# Patient Record
Sex: Female | Born: 1975 | Race: White | Hispanic: No | Marital: Married | Smoking: Never smoker
Health system: Southern US, Community
[De-identification: ages and names within clinical notes are randomized; demographics above are authoritative.]

## PROBLEM LIST (undated history)

## (undated) DIAGNOSIS — F419 Anxiety disorder, unspecified: Secondary | ICD-10-CM

## (undated) DIAGNOSIS — M1990 Unspecified osteoarthritis, unspecified site: Secondary | ICD-10-CM

## (undated) DIAGNOSIS — I10 Essential (primary) hypertension: Secondary | ICD-10-CM

## (undated) DIAGNOSIS — G43909 Migraine, unspecified, not intractable, without status migrainosus: Secondary | ICD-10-CM

## (undated) HISTORY — DX: Anxiety disorder, unspecified: F41.9

## (undated) HISTORY — DX: Migraine, unspecified, not intractable, without status migrainosus: G43.909

## (undated) HISTORY — DX: Unspecified osteoarthritis, unspecified site: M19.90

## (undated) HISTORY — DX: Essential (primary) hypertension: I10

---

## 2018-07-20 ENCOUNTER — Encounter: Payer: Self-pay | Admitting: Family Medicine

## 2018-07-20 ENCOUNTER — Ambulatory Visit: Payer: BC Managed Care – PPO | Admitting: Family Medicine

## 2018-07-20 VITALS — BP 138/90 | HR 88 | Temp 98.2°F | Resp 12 | Ht 62.0 in | Wt 148.5 lb

## 2018-07-20 DIAGNOSIS — M159 Polyosteoarthritis, unspecified: Secondary | ICD-10-CM | POA: Insufficient documentation

## 2018-07-20 DIAGNOSIS — G43109 Migraine with aura, not intractable, without status migrainosus: Secondary | ICD-10-CM | POA: Insufficient documentation

## 2018-07-20 DIAGNOSIS — R030 Elevated blood-pressure reading, without diagnosis of hypertension: Secondary | ICD-10-CM

## 2018-07-20 DIAGNOSIS — F419 Anxiety disorder, unspecified: Secondary | ICD-10-CM

## 2018-07-20 DIAGNOSIS — Z13220 Encounter for screening for lipoid disorders: Secondary | ICD-10-CM | POA: Diagnosis not present

## 2018-07-20 LAB — BASIC METABOLIC PANEL
BUN: 9 mg/dL (ref 6–23)
CO2: 28 mEq/L (ref 19–32)
Calcium: 10 mg/dL (ref 8.4–10.5)
Chloride: 103 mEq/L (ref 96–112)
Creatinine, Ser: 0.62 mg/dL (ref 0.40–1.20)
GFR: 111.5 mL/min (ref >60.00)
Glucose, Bld: 96 mg/dL (ref 70–99)
Potassium: 4.8 mEq/L (ref 3.5–5.1)
Sodium: 140 mEq/L (ref 135–145)

## 2018-07-20 LAB — CBC
HCT: 43.2 % (ref 36.0–46.0)
Hemoglobin: 14.9 g/dL (ref 12.0–15.0)
MCHC: 34.6 g/dL (ref 30.0–36.0)
MCV: 85.3 fl (ref 78.0–100.0)
Platelets: 237 10*3/uL (ref 150.0–400.0)
RBC: 5.06 Mil/uL (ref 3.87–5.11)
RDW: 12.4 % (ref 11.5–15.5)
WBC: 7.6 10*3/uL (ref 4.0–10.5)

## 2018-07-20 LAB — LIPID PANEL
CHOLESTEROL: 180 mg/dL (ref 0–200)
HDL: 55.8 mg/dL (ref >39.00)
LDL Cholesterol: 104 mg/dL — ABNORMAL HIGH (ref 0–99)
NonHDL: 124.55
Total CHOL/HDL Ratio: 3
Triglycerides: 102 mg/dL (ref 0.0–149.0)
VLDL: 20.4 mg/dL (ref 0.0–40.0)

## 2018-07-20 LAB — TSH: TSH: 1.2 u[IU]/mL (ref 0.35–4.50)

## 2018-07-20 NOTE — Patient Instructions (Addendum)
A few things to remember from today's visit:   Elevated blood pressure reading - Plan: CBC, Basic metabolic panel, TSH  Generalized osteoarthritis of multiple sites  Screening for lipid disorders - Plan: Lipid panel  Continue monitoring blood pressure at home periodically. Low-salt diet and regular physical activity may help.  Osteoarthritis is a chronic condition and gets worse with age.  The following may help:  Over the counter topical medications: Icy Hot or Asper cream with Lidocaine. Tai Chi may help or any type of stretching, low impact exercise.  Avoid weight gain. Fish oil, over the counter Megared for example, 2 capsules daily. Turmeric with black pepper.     Please be sure medication list is accurate. If a new problem present, please set up appointment sooner than planned today.

## 2018-07-20 NOTE — Progress Notes (Signed)
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  Former PCP:N/A Last preventive routine visit: 04/12/2017, gynecologic preventive care. She is from [place] and taking test in order to be able to apply for residency. She moved from [place] in 03/2018.  Chronic medical problems:  Migraine headaches,improved after discontinuing OCPs about 3 months ago, still having frequent episodes but not as intense. Throbbing like pain, 7-8/10, it last most of the time < 24 hours but sometimes 48-72 bur mild.  She is on Relpax as needed. She is on Propranolol, which was initially prescribed for anxiety, she takes it prn. Dx with migraine 3-4 years ago. Right temporal with associated photophobia and nausea,no vomiting. Proceed sometime by visual aura.  She was in the ER in 2018 an reporting negative head CT.  Anxiety: Exacerbated by USMLA testing. She denies depressed mood or suicidal thoughts.  Concerns today: Elevated BP. No prior history of hypertension. She has been checking BP for the past couple months because episodes of face flash. Denies associated unusual headaches, visual changes, chest pain, dyspnea, palpitation, claudication, focal weakness, or edema.  BP 130-140's/90's. No Hx of HTN. She would like FLP done today.   She has not been exercising regularly and has not been consistent with a healthy diet,so she has gained some wt.  Joint pain, for years,exacerbated by repetitive movement when working up and prolonged walking (knees). IP joints,right hip,and knees.  No joint edema or erythema. She doe snot take OTC treatment.   Review of Systems  Constitutional: Negative for activity change, appetite change, fatigue, fever and unexpected weight change.  HENT: Negative for mouth sores, nosebleeds and trouble swallowing.   Eyes: Positive for photophobia. Negative for redness and visual disturbance.  Respiratory: Negative for  cough, shortness of breath and wheezing.   Cardiovascular: Negative for chest pain, palpitations and leg swelling.  Gastrointestinal: Positive for nausea. Negative for abdominal pain and vomiting.       Negative for changes in bowel habits.  Genitourinary: Negative for decreased urine volume, dysuria and hematuria.  Musculoskeletal: Positive for arthralgias. Negative for gait problem and joint swelling.  Neurological: Positive for headaches. Negative for syncope, weakness and numbness.  Psychiatric/Behavioral: Negative for confusion. The patient is nervous/anxious.       Current Outpatient Medications on File Prior to Visit  Medication Sig Dispense Refill  . amitriptyline (ELAVIL) 25 MG tablet Take 25 mg by mouth at bedtime. 1/2 tablet daily    . Cholecalciferol (VITAMIN D3) 50 MCG (2000 UT) TABS Take by mouth. 1 tablet twice a week    . eletriptan (RELPAX) 20 MG tablet Take 20 mg by mouth as needed for migraine or headache. May repeat in 2 hours if headache persists or recurs.    . Multiple Vitamin (MULTIVITAMIN) tablet Take 1 tablet by mouth. 1 tablet twice a week    . Omega-3 Fatty Acids (OMEGA 3 PO) Take 100 mg by mouth daily.    . propranolol (INDERAL) 10 MG tablet Take 10 mg by mouth as needed.    . sertraline (ZOLOFT) 25 MG tablet Take 25 mg by mouth daily. 1/2 tablet daily     No current facility-administered medications on file prior to visit.      Past Medical History:  Diagnosis Date  . Arthritis   . Hypertension   . Migraines    No Known Allergies  Family History  Problem Relation Age of  Onset  . Arthritis Mother   . Hypertension Mother     Social History   Socioeconomic History  . Marital status: Married    Spouse name: Not on file  . Number of children: Not on file  . Years of education: Not on file  . Highest education level: Not on file  Occupational History  . Not on file  Social Needs  . Financial resource strain: Not on file  . Food insecurity:      Worry: Not on file    Inability: Not on file  . Transportation needs:    Medical: Not on file    Non-medical: Not on file  Tobacco Use  . Smoking status: Never Smoker  . Smokeless tobacco: Never Used  Substance and Sexual Activity  . Alcohol use: Not Currently  . Drug use: Never  . Sexual activity: Yes    Partners: Male  Lifestyle  . Physical activity:    Days per week: Not on file    Minutes per session: Not on file  . Stress: Not on file  Relationships  . Social connections:    Talks on phone: Not on file    Gets together: Not on file    Attends religious service: Not on file    Active member of club or organization: Not on file    Attends meetings of clubs or organizations: Not on file    Relationship status: Not on file  Other Topics Concern  . Not on file  Social History Narrative  . Not on file    Vitals:   07/20/18 1145 07/20/18 1233  BP: 124/83 138/90  Pulse: 88   Resp: 12   Temp: 98.2 F (36.8 C)   SpO2: 99%     Body mass index is 27.16 kg/m.  Physical Exam  Nursing note and vitals reviewed. Constitutional: She is oriented to person, place, and time. She appears well-developed and well-nourished. No distress.  HENT:  Head: Normocephalic and atraumatic.  Mouth/Throat: Oropharynx is clear and moist and mucous membranes are normal.  Eyes: Pupils are equal, round, and reactive to light. Conjunctivae are normal.  Cardiovascular: Normal rate and regular rhythm.  No murmur heard. Pulses:      Dorsalis pedis pulses are 2+ on the right side and 2+ on the left side.  Respiratory: Effort normal and breath sounds normal. No respiratory distress.  GI: Soft. She exhibits no mass. There is no hepatomegaly. There is no abdominal tenderness.  Musculoskeletal:        General: No edema.     Comments: No signs of synovitis or deformities.  Lymphadenopathy:    She has no cervical adenopathy.  Neurological: She is alert and oriented to person, place, and time.  She has normal strength. No cranial nerve deficit. Gait normal.  Skin: Skin is warm. No rash noted. No erythema.  Psychiatric: She has a normal mood and affect.  Well groomed, good eye contact.      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  Diagnoses and all orders for this visit:  Elevated blood pressure reading BP rechecked 138/90. We reviewed HTN criteria guidelines. She is not interested in starting antihypertensive med. She would like to monitor BP at home and start exercising regularly. Low salt diet recommended. F/U in 4-6 weeks.  -     CBC -     Basic metabolic panel -     TSH  Generalized osteoarthritis of multiple sites We  discussed treatment options and prognosis. Tylenol 500 mg tid,Tumeric with black pepper,and Fish oil may help.  Migraine with aura and without status migrainosus, not intractable Improved with stopping OCP's. She is not interested in Topamax because trying to get pregnant. Instructed about warning signs.  Screening for lipid disorders -     Lipid panel  Anxiety disorder, unspecified type No changes in Zoloft 25 mg daily. Continue Propranolol 10 mg bid as needed. Instructed about warning signs.   Recommend establishing with gyn/ob to discuss her meds and plan accordingly. Folic acid recommended.      Deontray Hunnicutt G. [place]Jordan, MD  [facility]. [place] office.

## 2018-07-23 ENCOUNTER — Telehealth: Payer: Self-pay | Admitting: Family Medicine

## 2018-07-23 ENCOUNTER — Encounter: Payer: Self-pay | Admitting: Family Medicine

## 2018-07-23 ENCOUNTER — Ambulatory Visit: Payer: Self-pay | Admitting: *Deleted

## 2018-07-23 NOTE — Telephone Encounter (Signed)
Copied from CRM [phone]. Topic: Quick Communication - Lab Results (Clinic Use ONLY) >> Jul 23, 2018 12:51 PM Jobe GibbonJones, Quaneisha S, CMA wrote: Called patient to inform them of their lab results. When patient returns call, triage nurse may disclose results.

## 2018-07-23 NOTE — Telephone Encounter (Signed)
Pt calling for lab results. See result note.   Reason for Disposition . Caller requesting lab results  Answer Assessment - Initial Assessment Questions 1. REASON FOR CALL or QUESTION: "What is your reason for calling today?" or "How can I best help you?" or "What question do you have that I can help answer?"    Call to receive lab results 2. CALLER: Document the source of call. (e.g., laboratory, patient).     patient  Protocols used: PCP CALL - NO TRIAGE-A-AH

## 2018-07-23 NOTE — Telephone Encounter (Signed)
See result note.  

## 2018-08-27 ENCOUNTER — Encounter: Payer: Self-pay | Admitting: Family Medicine

## 2018-08-27 ENCOUNTER — Ambulatory Visit: Payer: BC Managed Care – PPO | Admitting: Family Medicine

## 2018-08-27 VITALS — BP 126/84 | HR 100 | Temp 98.4°F | Resp 12 | Ht 62.0 in | Wt 142.5 lb

## 2018-08-27 DIAGNOSIS — F411 Generalized anxiety disorder: Secondary | ICD-10-CM | POA: Diagnosis not present

## 2018-08-27 DIAGNOSIS — G43109 Migraine with aura, not intractable, without status migrainosus: Secondary | ICD-10-CM

## 2018-08-27 DIAGNOSIS — Z30011 Encounter for initial prescription of contraceptive pills: Secondary | ICD-10-CM | POA: Diagnosis not present

## 2018-08-27 DIAGNOSIS — I10 Essential (primary) hypertension: Secondary | ICD-10-CM | POA: Diagnosis not present

## 2018-08-27 MED ORDER — AMITRIPTYLINE HCL 25 MG PO TABS: 25.0000 mg | ORAL_TABLET | Freq: Every day | ORAL | 1 refills | Status: DC

## 2018-08-27 MED ORDER — SERTRALINE HCL 25 MG PO TABS: 25.0000 mg | ORAL_TABLET | Freq: Every day | ORAL | 2 refills | Status: DC

## 2018-08-27 MED ORDER — NORGESTIMATE-ETH ESTRADIOL 0.25-35 MG-MCG PO TABS: 1.0000 | ORAL_TABLET | Freq: Every day | ORAL | 1 refills | Status: DC

## 2018-08-27 MED ORDER — ELETRIPTAN HYDROBROMIDE 20 MG PO TABS: 20.0000 mg | ORAL_TABLET | Freq: Every day | ORAL | 3 refills | Status: DC | PRN

## 2018-08-27 MED ORDER — LABETALOL HCL 100 MG PO TABS: 100.0000 mg | ORAL_TABLET | Freq: Two times a day (BID) | ORAL | 2 refills | Status: DC

## 2018-08-27 NOTE — Patient Instructions (Addendum)
A few things to remember from today's visit:   Encounter for initial prescription of contraceptive pills - Plan: norgestimate-ethinyl estradiol (ORTHO-CYCLEN, 28,) 0.25-35 MG-MCG tablet  Benign essential hypertension - Plan: labetalol (NORMODYNE) 100 MG tablet   Labetalol started today. Please be sure medication list is accurate. If a new problem present, please set up appointment sooner than planned today.

## 2018-08-27 NOTE — Progress Notes (Signed)
[name] is a 43 y.o.female, who is here today with her husband to follow on elevated BP. She was seen on 07/20/2018, at that time pharmacologic treatment for hypertension was recommended but she preferred to try nonpharmacologic treatment for now.  She takes propranolol 10 mg twice daily as needed for headache and anxiety.   She has not noted unusual headache, visual changes, exertional chest pain, dyspnea,  focal weakness, or edema. Home BP readings: Most 130's-140's/90's. A few SBP's 150's.   Lab Results  Component Value Date   CREATININE 0.62 07/20/2018   BUN 9 07/20/2018   NA 140 07/20/2018   K 4.8 07/20/2018   CL 103 07/20/2018   CO2 28 07/20/2018   + Stress.  She is not now sure if she wants to get pregnant. She would like to wait until her BP is better controlled. She would like to start OCP's low dose.  LMP 08/11/18  Migraine and insomnia,she is taking Amitriptyline 25 mg daily. It helps with sleep and helping with headache frequency. Throbbing right temporal headaches 2 times per month. Associated photophobia and nausea.  It has been better after stopping OCP's 4 months ago.  Anxiety, she is on Zoloft 25 mg daily. She denies depressed mood or suicidal thoughts. Exacerbated by stress, studying for her USMLA's, which she postponed again, plannin gin taking test 10/2018.    Review of Systems  Constitutional: Negative for activity change, appetite change, fatigue and fever.  HENT: Negative for mouth sores, nosebleeds and trouble swallowing.   Eyes: Negative for redness and visual disturbance.  Respiratory: Negative for cough, shortness of breath and wheezing.   Cardiovascular: Negative for chest pain, palpitations and leg swelling.  Gastrointestinal: Negative for abdominal pain and vomiting.       Negative for changes in bowel habits.  Genitourinary: Negative for decreased urine volume, hematuria and menstrual problem.  Neurological: Positive for  headaches. Negative for syncope and weakness.  Psychiatric/Behavioral: Positive for sleep disturbance. Negative for confusion. The patient is nervous/anxious.      Current Outpatient Medications on File Prior to Visit  Medication Sig Dispense Refill  . Cholecalciferol (VITAMIN D3) 50 MCG (2000 UT) TABS Take by mouth. 1 tablet twice a week    . Multiple Vitamin (MULTIVITAMIN) tablet Take 1 tablet by mouth. 1 tablet twice a week    . Omega-3 Fatty Acids (OMEGA 3 PO) Take 100 mg by mouth daily.     No current facility-administered medications on file prior to visit.      Past Medical History:  Diagnosis Date  . Anxiety   . Arthritis   . Hypertension   . Migraines     No Known Allergies  Social History   Socioeconomic History  . Marital status: Married    Spouse name: Not on file  . Number of children: Not on file  . Years of education: Not on file  . Highest education level: Not on file  Occupational History  . Not on file  Social Needs  . Financial resource strain: Not on file  . Food insecurity:    Worry: Not on file    Inability: Not on file  . Transportation needs:    Medical: Not on file    Non-medical: Not on file  Tobacco Use  . Smoking status: Never Smoker  . Smokeless tobacco: Never Used  Substance and Sexual Activity  . Alcohol use: Not Currently  . Drug use: Never  . Sexual activity: Yes  Partners: Male  Lifestyle  . Physical activity:    Days per week: Not on file    Minutes per session: Not on file  . Stress: Not on file  Relationships  . Social connections:    Talks on phone: Not on file    Gets together: Not on file    Attends religious service: Not on file    Active member of club or organization: Not on file    Attends meetings of clubs or organizations: Not on file    Relationship status: Not on file  Other Topics Concern  . Not on file  Social History Narrative  . Not on file    Vitals:   08/27/18 1416  BP: 126/84  Pulse: 100    Resp: 12  Temp: 98.4 F (36.9 C)  SpO2: 99%   Body mass index is 26.06 kg/m.   Physical Exam  Nursing note and vitals reviewed. Constitutional: She is oriented to person, place, and time. She appears well-developed and well-nourished. No distress.  HENT:  Head: Normocephalic and atraumatic.  Mouth/Throat: Oropharynx is clear and moist and mucous membranes are normal.  Eyes: Pupils are equal, round, and reactive to light. Conjunctivae are normal.  Cardiovascular: Normal rate and regular rhythm.  No murmur heard. Pulses:      Dorsalis pedis pulses are 2+ on the right side and 2+ on the left side.  Respiratory: Effort normal and breath sounds normal. No respiratory distress.  GI: Soft. She exhibits no mass. There is no hepatomegaly. There is no abdominal tenderness.  Musculoskeletal:        General: No edema.  Lymphadenopathy:    She has no cervical adenopathy.  Neurological: She is alert and oriented to person, place, and time. She has normal strength. No cranial nerve deficit. Gait normal.  Skin: Skin is warm. No rash noted. No erythema.  Psychiatric: She has a normal mood and affect.  Well groomed, good eye contact.    ASSESSMENT AND PLAN:   [name] was seen today for follow-up.  Diagnoses and all orders for this visit:  Benign essential hypertension Because she may be planning on getting pregnant within a year or so,Labetalol recommended. Labetalol side effects discussed. Continue monitoring BP. Low salt diet. F/U in 3 months.  -     labetalol (NORMODYNE) 100 MG tablet; Take 1 tablet (100 mg total) by mouth 2 (two) times daily.  Encounter for initial prescription of contraceptive pills A few months ago she felt like migraine improved after discontinuing OCP's. She would like to try again for at least 6 months. We discussed side effects,including thrombotic events,mood changes,anf ehadace -     norgestimate-ethinyl estradiol (ORTHO-CYCLEN, 28,) 0.25-35 MG-MCG  tablet; Take 1 tablet by mouth daily.   Migraine with aura and without status migrainosus, not intractable Improved. No changes in current management.  -     eletriptan (RELPAX) 20 MG tablet; Take 1 tablet (20 mg total) by mouth daily as needed for migraine or headache. May repeat in 2 hours if headache persists or recurs. -     amitriptyline (ELAVIL) 25 MG tablet; Take 1 tablet (25 mg total) by mouth at bedtime.  GAD (generalized anxiety disorder) Stable. No changes in Zoloft dose. Continue Propranolol bid prn.  -     sertraline (ZOLOFT) 25 MG tablet; Take 1 tablet (25 mg total) by mouth daily. 1/2 tablet daily     Return in about 3 months (around 11/26/2018) for HTN.    Kathie RhodesBetty  G. [place], MD  [facility]. [place] office.

## 2018-08-30 ENCOUNTER — Encounter: Payer: Self-pay | Admitting: Family Medicine

## 2018-10-10 ENCOUNTER — Ambulatory Visit: Payer: BC Managed Care – PPO | Admitting: Family Medicine

## 2018-11-19 ENCOUNTER — Other Ambulatory Visit: Payer: Self-pay | Admitting: Family Medicine

## 2018-11-19 DIAGNOSIS — I10 Essential (primary) hypertension: Secondary | ICD-10-CM

## 2018-11-26 ENCOUNTER — Other Ambulatory Visit: Payer: Self-pay

## 2018-11-26 ENCOUNTER — Encounter: Payer: BC Managed Care – PPO | Admitting: Family Medicine

## 2018-11-26 NOTE — Progress Notes (Signed)
Appt was cancelled.  

## 2019-01-28 ENCOUNTER — Other Ambulatory Visit: Payer: Self-pay

## 2019-01-28 ENCOUNTER — Ambulatory Visit (INDEPENDENT_AMBULATORY_CARE_PROVIDER_SITE_OTHER): Payer: BC Managed Care – PPO | Admitting: Family Medicine

## 2019-01-28 ENCOUNTER — Encounter: Payer: Self-pay | Admitting: Family Medicine

## 2019-01-28 VITALS — BP 122/74 | HR 91 | Temp 98.2°F | Resp 12 | Ht 62.0 in | Wt 143.2 lb

## 2019-01-28 DIAGNOSIS — Z3041 Encounter for surveillance of contraceptive pills: Secondary | ICD-10-CM | POA: Diagnosis not present

## 2019-01-28 DIAGNOSIS — L304 Erythema intertrigo: Secondary | ICD-10-CM

## 2019-01-28 DIAGNOSIS — I10 Essential (primary) hypertension: Secondary | ICD-10-CM | POA: Insufficient documentation

## 2019-01-28 DIAGNOSIS — H9313 Tinnitus, bilateral: Secondary | ICD-10-CM

## 2019-01-28 DIAGNOSIS — F411 Generalized anxiety disorder: Secondary | ICD-10-CM

## 2019-01-28 DIAGNOSIS — Z30011 Encounter for initial prescription of contraceptive pills: Secondary | ICD-10-CM | POA: Insufficient documentation

## 2019-01-28 MED ORDER — LABETALOL HCL 100 MG PO TABS: 100.0000 mg | ORAL_TABLET | Freq: Two times a day (BID) | ORAL | 1 refills | Status: DC

## 2019-01-28 MED ORDER — NORGESTIMATE-ETH ESTRADIOL 0.25-35 MG-MCG PO TABS: 1.0000 | ORAL_TABLET | Freq: Every day | ORAL | 3 refills | Status: DC

## 2019-01-28 MED ORDER — NYSTATIN-TRIAMCINOLONE 100000-0.1 UNIT/GM-% EX CREA: 1.0000 "application " | TOPICAL_CREAM | Freq: Two times a day (BID) | CUTANEOUS | 1 refills | Status: DC | PRN

## 2019-01-28 NOTE — Assessment & Plan Note (Signed)
We discussed possible etiologies, most likely benign and related to hearing loss. She would like to see ENT. Instructed about warning signs.

## 2019-01-28 NOTE — Assessment & Plan Note (Signed)
BP adequately controlled. BP goal <= 130/80. No changes to labetalol 100 mg twice daily. Continue monitoring BP at home. Low-salt diet recommended. She would like to follow annually, before if needed.

## 2019-01-28 NOTE — Patient Instructions (Signed)
A few things to remember from today's visit:   Tinnitus of both ears - Plan: Ambulatory referral to ENT  Encounter for initial prescription of contraceptive pills - Plan: norgestimate-ethinyl estradiol (ORTHO-CYCLEN, 28,) 0.25-35 MG-MCG tablet  Intertrigo - Plan: nystatin-triamcinolone (MYCOLOG II) cream  Benign essential hypertension - Plan: labetalol (NORMODYNE) 100 MG tablet   Please be sure medication list is accurate. If a new problem present, please set up appointment sooner than planned today.

## 2019-01-28 NOTE — Assessment & Plan Note (Signed)
Otherwise stable. No changes in sertraline 25 mg daily. Instructed about warning signs. We will continue following annually, before if needed.

## 2019-01-28 NOTE — Progress Notes (Signed)
HPI:   [name] is a 43 y.o. female, who is here today for chronic disease management.  She was last seen on 11/26/2018. Today she is complaining about 3 years history of tinnitus, it seems to be getting worse in dry ear. She has seen ENT in the past and was Dx with mild hearing loss, she has not noted any change. Negative for earache and ear drainage. States that she cannot pop her right ear when she does auto inflation maneuvers.  Also having intermittent nasal congestion, she thinks she may have nasal polyps. Nosebleed intermittently for 3 days. She has not identified exacerbating or alleviating factors. She is not using OTC medications.   HTN Currently she is on labetalol 100 mg twice daily.  She is checking BPs regularly, usually 120s/80s, occasionally 140s/90. She is tolerating medication well. She denies unusual headache, visual changes, chest pain, dyspnea, palpitation, focal weakness, or edema.  Lab Results  Component Value Date   CREATININE 0.62 07/20/2018   BUN 9 07/20/2018   NA 140 07/20/2018   K 4.8 07/20/2018   CL 103 07/20/2018   CO2 28 07/20/2018   Anxiety, currently she is on sertraline 25 mg daily. She denies depressed mood. Her USMLA testing was postponed for about 18 months, this has exacerbated anxiety.  She also takes amitriptyline 25 mg at night to treat migraine headaches.  Requesting refill on OCPs, which she has tolerated well. She is not sure about trying to get pregnant given her age,also trying to take all exams needed to apply for residency.  She is also complaining about a couple months of erythematous and very pruritic rash on her breast, bilateral. He seems to be exacerbated by health weather and sweating. She has not tried OTC medications.  Review of Systems  Constitutional: Negative for activity change, appetite change, fatigue and fever.  HENT: Negative for mouth sores, sinus pressure, sore throat and trouble  swallowing.   Eyes: Negative for redness and itching.  Respiratory: Negative for cough, shortness of breath and wheezing.   Gastrointestinal: Negative for abdominal pain, nausea and vomiting.       Negative for changes in bowel habits.  Genitourinary: Negative for decreased urine volume, dysuria and hematuria.  Musculoskeletal: Negative for gait problem and myalgias.  Skin: Negative for rash.  Allergic/Immunologic: Positive for environmental allergies.  Neurological: Negative for syncope, facial asymmetry and speech difficulty.  Hematological: Negative for adenopathy. Does not bruise/bleed easily.  Psychiatric/Behavioral: Negative for sleep disturbance and suicidal ideas.  Rest see pertinent positives and negatives per HPI.   Current Outpatient Medications on File Prior to Visit  Medication Sig Dispense Refill  . amitriptyline (ELAVIL) 25 MG tablet Take 1 tablet (25 mg total) by mouth at bedtime. 90 tablet 1  . Cholecalciferol (VITAMIN D3) 50 MCG (2000 UT) TABS Take by mouth. 1 tablet twice a week    . eletriptan (RELPAX) 20 MG tablet Take 1 tablet (20 mg total) by mouth daily as needed for migraine or headache. May repeat in 2 hours if headache persists or recurs. 10 tablet 3  . Multiple Vitamin (MULTIVITAMIN) tablet Take 1 tablet by mouth. 1 tablet twice a week    . Omega-3 Fatty Acids (OMEGA 3 PO) Take 100 mg by mouth daily.    . sertraline (ZOLOFT) 25 MG tablet Take 1 tablet (25 mg total) by mouth daily. 1/2 tablet daily 90 tablet 2   No current facility-administered medications on file prior to visit.  Past Medical History:  Diagnosis Date  . Anxiety   . Arthritis   . Hypertension   . Migraines    No Known Allergies  Social History   Socioeconomic History  . Marital status: Married    Spouse name: Not on file  . Number of children: Not on file  . Years of education: Not on file  . Highest education level: Not on file  Occupational History  . Not on file  Social  Needs  . Financial resource strain: Not on file  . Food insecurity    Worry: Not on file    Inability: Not on file  . Transportation needs    Medical: Not on file    Non-medical: Not on file  Tobacco Use  . Smoking status: Never Smoker  . Smokeless tobacco: Never Used  Substance and Sexual Activity  . Alcohol use: Not Currently  . Drug use: Never  . Sexual activity: Yes    Partners: Male  Lifestyle  . Physical activity    Days per week: Not on file    Minutes per session: Not on file  . Stress: Not on file  Relationships  . Social Musicianconnections    Talks on phone: Not on file    Gets together: Not on file    Attends religious service: Not on file    Active member of club or organization: Not on file    Attends meetings of clubs or organizations: Not on file    Relationship status: Not on file  Other Topics Concern  . Not on file  Social History Narrative  . Not on file    Vitals:   01/28/19 1635  BP: 122/74  Pulse: 91  Resp: 12  Temp: 98.2 F (36.8 C)  SpO2: 98%   Body mass index is 26.2 kg/m.  Physical Exam  Nursing note and vitals reviewed. Constitutional: She is oriented to person, place, and time. She appears well-developed and well-nourished. No distress.  HENT:  Head: Normocephalic and atraumatic.  Right Ear: External ear and ear canal normal. A middle ear effusion is present.  Left Ear: Tympanic membrane, external ear and ear canal normal.  Nose: Septal deviation present. No mucosal edema, rhinorrhea or nasal septal hematoma.  Mouth/Throat: Oropharynx is clear and moist and mucous membranes are normal.  Septum mucosa with small bloody area, 1 x each nostril, where bleeding occurred. No active bleeding.  Eyes: Pupils are equal, round, and reactive to light. Conjunctivae are normal.  Cardiovascular: Normal rate and regular rhythm.  No murmur heard. Pulses:      Dorsalis pedis pulses are 2+ on the right side and 2+ on the left side.  Respiratory: Effort  normal and breath sounds normal. No respiratory distress.  GI: Soft. She exhibits no mass. There is no hepatomegaly. There is no abdominal tenderness.  Musculoskeletal:        General: No edema.  Lymphadenopathy:    She has no cervical adenopathy.  Neurological: She is alert and oriented to person, place, and time. She has normal strength. No cranial nerve deficit. Gait normal.  Skin: Skin is warm. Rash noted. Rash is macular. There is erythema.  Erythematous area under left breast.  Psychiatric: Her mood appears anxious.  Well groomed, good eye contact.    ASSESSMENT AND PLAN:  Ms. [name] was seen today for tinnitus and feels like polyp in nose.  Diagnoses and all orders for this visit:  Intertrigo Topical treatment recommended. Recommend trying to keep area  dry. Follow-up as needed.  -     nystatin-triamcinolone (MYCOLOG II) cream; Apply 1 application topically 2 (two) times daily as needed.  GAD (generalized anxiety disorder) Otherwise stable. No changes in sertraline 25 mg daily. Instructed about warning signs. We will continue following annually, before if needed.  Tinnitus of both ears We discussed possible etiologies, most likely benign and related to hearing loss. She would like to see ENT. Instructed about warning signs.  Encounter for initial prescription of contraceptive pills Tolerating OCP as well. No changes in current management. We discussed some side effects. She can continue following annually.  Benign essential hypertension BP adequately controlled. BP goal <= 130/80. No changes to labetalol 100 mg twice daily. Continue monitoring BP at home. Low-salt diet recommended. She would like to follow annually, before if needed.   Return in about 1 year (around 01/28/2020) for cpe.   -[name] was advised to return sooner than planned today if new concerns arise.    Veronica Fisher G. [place]az[place], MD  [facility]. [place] office.

## 2019-01-28 NOTE — Assessment & Plan Note (Signed)
Tolerating OCP as well. No changes in current management. We discussed some side effects. She can continue following annually.

## 2019-02-04 ENCOUNTER — Other Ambulatory Visit: Payer: Self-pay | Admitting: Family Medicine

## 2019-02-04 DIAGNOSIS — Z3041 Encounter for surveillance of contraceptive pills: Secondary | ICD-10-CM

## 2019-02-10 ENCOUNTER — Other Ambulatory Visit: Payer: Self-pay | Admitting: Family Medicine

## 2019-02-10 DIAGNOSIS — I10 Essential (primary) hypertension: Secondary | ICD-10-CM

## 2019-02-25 ENCOUNTER — Other Ambulatory Visit: Payer: Self-pay | Admitting: Family Medicine

## 2019-02-25 DIAGNOSIS — G43109 Migraine with aura, not intractable, without status migrainosus: Secondary | ICD-10-CM

## 2019-03-01 ENCOUNTER — Other Ambulatory Visit: Payer: Self-pay | Admitting: Family Medicine

## 2019-03-01 DIAGNOSIS — G43109 Migraine with aura, not intractable, without status migrainosus: Secondary | ICD-10-CM

## 2019-03-05 NOTE — Telephone Encounter (Signed)
Patient need to schedule an ov for more refills. 

## 2019-03-19 ENCOUNTER — Telehealth: Payer: Self-pay | Admitting: Family Medicine

## 2019-03-19 NOTE — Telephone Encounter (Signed)
eletriptan (RELPAX) 20 MG tablet   Send to [facility]

## 2019-03-19 NOTE — Telephone Encounter (Signed)
Message sent to Dr. Jordan for review and approval. 

## 2019-03-26 ENCOUNTER — Other Ambulatory Visit: Payer: Self-pay | Admitting: Family Medicine

## 2019-03-26 DIAGNOSIS — G43109 Migraine with aura, not intractable, without status migrainosus: Secondary | ICD-10-CM

## 2019-03-26 MED ORDER — ELETRIPTAN HYDROBROMIDE 20 MG PO TABS: 20.0000 mg | ORAL_TABLET | Freq: Every day | ORAL | 3 refills | Status: DC | PRN

## 2019-03-26 NOTE — Telephone Encounter (Signed)
Patient informed and verbalized understanding

## 2019-03-26 NOTE — Telephone Encounter (Signed)
Rx sent. Please advise that if she needs to take med more frequent,we will need follow up visit. Thanks, BJ

## 2019-04-10 ENCOUNTER — Encounter: Payer: Self-pay | Admitting: Family Medicine

## 2019-04-10 ENCOUNTER — Ambulatory Visit: Payer: BC Managed Care – PPO | Admitting: Family Medicine

## 2019-04-10 ENCOUNTER — Other Ambulatory Visit: Payer: Self-pay

## 2019-04-10 VITALS — BP 90/70 | HR 82 | Temp 98.4°F | Resp 12 | Ht 62.0 in | Wt 142.4 lb

## 2019-04-10 DIAGNOSIS — L811 Chloasma: Secondary | ICD-10-CM | POA: Diagnosis not present

## 2019-04-10 MED ORDER — HYDROQUINONE 4 % EX CREA: TOPICAL_CREAM | Freq: Two times a day (BID) | CUTANEOUS | 6 refills | Status: AC

## 2019-04-10 MED ORDER — TRETINOIN 0.1 % EX CREA: TOPICAL_CREAM | Freq: Every day | CUTANEOUS | 6 refills | Status: DC

## 2019-04-10 NOTE — Progress Notes (Signed)
ACUTE VISIT   HPI:  Chief Complaint  Patient presents with  . Skin Discoloration    Pt present for skin discoloration around hair line to cheek bone. Pt stated that it started off as a spot on her left side of face a year ago. Now pt stated that the spot has spreaded to other side of face and hairline. Pt has been taking retinol with mild relief.     Veronica Fisher is a 43 y.o. female, who is here today complaining of hyperpigmented areas on face as described above. She is on OCP's and seems to be getting darker for the past few months. Problem is constant.  No pain,erythema,or pruritus. She is applying retinol 0.05% and would like a higher %.  She has not noted pigmentation changes in other areas.  Negative for fever,chills,unusual fatigue,or arthralgias.  Review of Systems  Constitutional: Negative for activity change, appetite change and unexpected weight change.  HENT: Negative for mouth sores and sore throat.   Eyes: Negative for discharge and redness.  Respiratory: Negative for cough, chest tightness, shortness of breath and wheezing.   Gastrointestinal: Negative for abdominal pain, nausea and vomiting.  Endocrine: Negative for cold intolerance and heat intolerance.  Musculoskeletal: Negative for gait problem and myalgias.  Skin: Negative for rash.  Neurological: Negative for weakness, numbness and headaches.  Rest see pertinent positives and negatives per HPI.   Current Outpatient Medications on File Prior to Visit  Medication Sig Dispense Refill  . amitriptyline (ELAVIL) 25 MG tablet TAKE 1 TABLET BY MOUTH EVERYDAY AT BEDTIME 90 tablet 1  . Cholecalciferol (VITAMIN D3) 50 MCG (2000 UT) TABS Take by mouth. 1 tablet twice a week    . eletriptan (RELPAX) 20 MG tablet Take 1 tablet (20 mg total) by mouth daily as needed for migraine or headache. May repeat in 2 hours if headache persists or recurs. 10 tablet 3  . labetalol (NORMODYNE) 100 MG tablet TAKE 1  TABLET BY MOUTH TWICE A DAY 180 tablet 0  . Multiple Vitamin (MULTIVITAMIN) tablet Take 1 tablet by mouth. 1 tablet twice a week    . nystatin-triamcinolone (MYCOLOG II) cream Apply 1 application topically 2 (two) times daily as needed. 45 g 1  . Omega-3 Fatty Acids (OMEGA 3 PO) Take 100 mg by mouth daily.    . sertraline (ZOLOFT) 25 MG tablet Take 1 tablet (25 mg total) by mouth daily. 1/2 tablet daily 90 tablet 2  . SPRINTEC 28 0.25-35 MG-MCG tablet TAKE 1 TABLET BY MOUTH EVERY DAY 84 tablet 1   No current facility-administered medications on file prior to visit.      Past Medical History:  Diagnosis Date  . Anxiety   . Arthritis   . Hypertension   . Migraines    No Known Allergies  Social History   Socioeconomic History  . Marital status: Married    Spouse name: Not on file  . Number of children: Not on file  . Years of education: Not on file  . Highest education level: Not on file  Occupational History  . Not on file  Social Needs  . Financial resource strain: Not on file  . Food insecurity    Worry: Not on file    Inability: Not on file  . Transportation needs    Medical: Not on file    Non-medical: Not on file  Tobacco Use  . Smoking status: Never Smoker  . Smokeless tobacco: Never Used  Substance  and Sexual Activity  . Alcohol use: Not Currently  . Drug use: Never  . Sexual activity: Yes    Partners: Male  Lifestyle  . Physical activity    Days per week: Not on file    Minutes per session: Not on file  . Stress: Not on file  Relationships  . Social Musicianconnections    Talks on phone: Not on file    Gets together: Not on file    Attends religious service: Not on file    Active member of club or organization: Not on file    Attends meetings of clubs or organizations: Not on file    Relationship status: Not on file  Other Topics Concern  . Not on file  Social History Narrative  . Not on file    Vitals:   04/10/19 1131  BP: 90/70  Pulse: 82  Resp: 12   Temp: 98.4 F (36.9 C)  SpO2: 99%   Body mass index is 26.05 kg/m.  Physical Exam  Nursing note and vitals reviewed. Constitutional: She is oriented to person, place, and time. She appears well-developed. No distress.  HENT:  Head: Normocephalic and atraumatic.    Mouth/Throat: Oropharynx is clear and moist and mucous membranes are normal.  Eyes: Conjunctivae are normal.  Cardiovascular: Normal rate and regular rhythm.  No murmur heard. Respiratory: Effort normal and breath sounds normal. No respiratory distress.  Musculoskeletal:        General: No edema.  Lymphadenopathy:    She has no cervical adenopathy.  Neurological: She is alert and oriented to person, place, and time. She has normal strength. Gait normal.  Skin: Skin is warm. No rash noted. No erythema.  Slight hyperpigmented macular lesions on forehead and left cheek. No tender. See graphic HENT.  Psychiatric: Her mood appears anxious.  Well groomed, good eye contact.   ASSESSMENT AND PLAN:  Ms. [name] was seen today for skin discoloration.  Diagnoses and all orders for this visit:  Melasma -     tretinoin (RETIN-A) 0.1 % cream; Apply topically at bedtime. -     hydroquinone 4 % cream; Apply topically 2 (two) times daily.  Other possible etiologies discussed. Examination and Hx do not suggest a serious process. Treatment options discussed. Tretinoin 0.1% and Hydroquinone recommended.  OCP's could be aggravating problem. Sun screening. F/U as needed.  Return if symptoms worsen or fail to improve.    Zahirah Cheslock G. [place], MD  [facility]. [place] office.

## 2019-04-10 NOTE — Patient Instructions (Signed)
A few things to remember from today's visit:   Melasma - Plan: tretinoin (RETIN-A) 0.1 % cream, hydroquinone 4 % cream   Melasma Melasma is a skin condition that causes areas of darker coloring. It usually appears in patches on the cheeks, forehead, upper lip, and neck. These patches can look like a mask. The discolored areas do not itch and are not red or swollen. Melasma is not contagious. This means that it does not spread from person to person. What are the causes? The cause of this condition is not known. However, it can be started by certain triggers, such as:  Being out in the sun.  Allergies to medicines or cosmetics.  Changes in your hormones, such as: ? Taking birth control medicines. ? Taking hormone replacement therapy. ? Being pregnant. What increases the risk? The following factors may make you more likely to develop this condition:  Being a woman. Melasma is less common in men.  Having a family history of melasma.  Having darker skin.  Living in a tropical climate. What are the signs or symptoms? The only symptom of this condition is dark or tan patches on the skin. How is this diagnosed? This condition is diagnosed based on:  A physical exam. Your health care provider will examine the physical appearance of your skin. He or she may use an ultraviolet light, called a Wood lamp, to look more closely at your skin.  Biopsy. A small sample of your skin is taken and examined under a microscope. This is done to make sure your melasma is not caused by another skin condition, such as skin cancer. How is this treated? There is no cure for this condition. However, there are treatments that may lighten the color of the darker patches. Treatment may include:  Medicines, such as bleaching or steroid creams.  Facial or chemical peels.  Laser treatment.  Dermabrasion or microdermabrasion. These procedures use fine instruments to scrape and remove the outer layer of skin  in order to grow new, healthy-looking skin. Your melasma may also go away on its own over time. Follow these instructions at home:  Lifestyle  Avoid overexposure to the sun, especially in tropical areas.  Wear sunscreen with an SPF of 30 or higher every day.  Wear a hat that protects your face from the sun.  Use gentle cosmetics that are meant for sensitive skin.  Do not use wax to remove excess hair in areas where you have or have had melasma. General instructions  Take or apply over-the-counter and prescription medicines only as told by your health care provider.  Keep all follow-up visits as told by your health care provider. This is important. Contact a health care provider if:  You have new symptoms.  Your symptoms get worse.  Your affected skin areas are: ? Bleeding. ? Irritated. Summary  Melasma is a skin condition that causes areas of darker coloring that do not itch and are not red or swollen.  The cause of this condition is not known. However, it can be started by certain triggers such as sun exposure, allergies to medicines or cosmetics, or changes in your hormones.  Risk factors include being a woman, having a family history of melasma, having darker skin, or living in a tropical climate.  There is no cure for this condition. However, there are treatments that may lighten the color of the darker patches. They include medicine, facial or chemical peels, laser treatment, dermabrasion, or microdermabrasion. This information is not intended  to replace advice given to you by your health care provider. Make sure you discuss any questions you have with your health care provider. Document Released: 10/08/2002 Document Revised: 07/31/2017 Document Reviewed: 07/31/2017 Elsevier Patient Education  2020 [employer]  Please be sure medication list is accurate. If a new problem present, please set up appointment sooner than planned today.

## 2019-04-11 ENCOUNTER — Encounter: Payer: Self-pay | Admitting: Family Medicine

## 2019-04-18 ENCOUNTER — Other Ambulatory Visit: Payer: Self-pay | Admitting: Family Medicine

## 2019-04-18 DIAGNOSIS — L811 Chloasma: Secondary | ICD-10-CM

## 2019-04-18 MED ORDER — TRETINOIN 0.1 % EX CREA: TOPICAL_CREAM | Freq: Every day | CUTANEOUS | 6 refills | Status: DC

## 2019-04-18 NOTE — Telephone Encounter (Signed)
Patient stated that [facility] [address] stated they did not get her tretinoin (RETIN-A) 0.1 % cream prescription.  Please provide.

## 2019-05-28 ENCOUNTER — Other Ambulatory Visit: Payer: Self-pay | Admitting: Family Medicine

## 2019-05-28 DIAGNOSIS — F411 Generalized anxiety disorder: Secondary | ICD-10-CM

## 2019-05-30 NOTE — Telephone Encounter (Signed)
Forwarding to PCP for approval  

## 2019-07-16 ENCOUNTER — Other Ambulatory Visit: Payer: Self-pay | Admitting: Family Medicine

## 2019-07-16 DIAGNOSIS — I10 Essential (primary) hypertension: Secondary | ICD-10-CM

## 2019-08-25 ENCOUNTER — Other Ambulatory Visit: Payer: Self-pay | Admitting: Family Medicine

## 2019-08-25 DIAGNOSIS — G43109 Migraine with aura, not intractable, without status migrainosus: Secondary | ICD-10-CM

## 2019-10-24 ENCOUNTER — Ambulatory Visit: Payer: BC Managed Care – PPO | Attending: Family

## 2019-10-24 DIAGNOSIS — Z23 Encounter for immunization: Secondary | ICD-10-CM

## 2019-10-24 NOTE — Progress Notes (Signed)
   Covid-19 Vaccination Clinic  Name:  Veronica Fisher    MRN: [id number] DOB: [birth year]  10/24/2019  [name] was observed post Covid-19 immunization for 15 minutes without incident. She was provided with Vaccine Information Sheet and instruction to access the V-Safe system.   [name] was instructed to call 911 with any severe reactions post vaccine: Marland Kitchen Difficulty breathing  . Swelling of face and throat  . A fast heartbeat  . A bad rash all over body  . Dizziness and weakness   Immunizations Administered    Name Date Dose VIS Date Route   Moderna COVID-19 Vaccine 10/24/2019 11:05 AM 0.5 mL 07/02/2019 Intramuscular   Manufacturer: Moderna   Lot: [id number]   NDC: 97948-016-55

## 2019-10-25 ENCOUNTER — Other Ambulatory Visit: Payer: Self-pay | Admitting: Family Medicine

## 2019-10-25 DIAGNOSIS — Z3041 Encounter for surveillance of contraceptive pills: Secondary | ICD-10-CM

## 2019-11-04 ENCOUNTER — Encounter: Payer: Self-pay | Admitting: Family Medicine

## 2019-11-04 ENCOUNTER — Ambulatory Visit (INDEPENDENT_AMBULATORY_CARE_PROVIDER_SITE_OTHER): Payer: BC Managed Care – PPO | Admitting: Family Medicine

## 2019-11-04 ENCOUNTER — Other Ambulatory Visit: Payer: Self-pay

## 2019-11-04 VITALS — BP 110/76 | HR 94 | Temp 97.5°F | Resp 12 | Ht 62.0 in | Wt 138.4 lb

## 2019-11-04 DIAGNOSIS — G43109 Migraine with aura, not intractable, without status migrainosus: Secondary | ICD-10-CM

## 2019-11-04 DIAGNOSIS — Z3A01 Less than 8 weeks gestation of pregnancy: Secondary | ICD-10-CM

## 2019-11-04 DIAGNOSIS — F411 Generalized anxiety disorder: Secondary | ICD-10-CM | POA: Diagnosis not present

## 2019-11-04 DIAGNOSIS — O209 Hemorrhage in early pregnancy, unspecified: Secondary | ICD-10-CM | POA: Diagnosis not present

## 2019-11-04 DIAGNOSIS — I10 Essential (primary) hypertension: Secondary | ICD-10-CM

## 2019-11-04 NOTE — Assessment & Plan Note (Signed)
Problem is well controlled with Sertraline 25 mg daily. We discussed some side effects and possible effects of fetal harm. Risk vs benefits discussed. Medication can be weaned off before 2nd trimeter or before if consider appropriate.

## 2019-11-04 NOTE — Assessment & Plan Note (Signed)
Problem is well controlled. Problem may improved during pregnancy. Recommend weaning off Amitriptyline.

## 2019-11-04 NOTE — Assessment & Plan Note (Signed)
BP adequately controlled. Continue Labetalol 100 mg bid and low salt diet. Instructed about warning signs.

## 2019-11-04 NOTE — Patient Instructions (Signed)
A few things to remember from today's visit:   Vaginal bleeding affecting early pregnancy - Plan: HCG, Quant, Pregnancy, ABO/Rh, US Pelvic Complete With Transvaginal, CBC, Ambulatory referral to Obstetrics / Gynecology  Start weaning off Amitriptyline. No changes in Sertraline for now. Appr with gyn is being arranged.  Vaginal Bleeding During Pregnancy, First Trimester  A small amount of bleeding (spotting) from the vagina is common during early pregnancy. Sometimes the bleeding is normal and does not cause problems. At other times, though, bleeding may be a sign of something serious. Tell your doctor about any bleeding from your vagina right away. Follow these instructions at home: Activity  Follow your doctor's instructions about how active you can be.  If needed, make plans for someone to help with your normal activities.  Do not have sex or orgasms until your doctor says that this is safe. General instructions  Take over-the-counter and prescription medicines only as told by your doctor.  Watch your condition for any changes.  Write down: ? The number of pads you use each day. ? How often you change pads. ? How soaked (saturated) your pads are.  Do not use tampons.  Do not douche.  If you pass any tissue from your vagina, save it to show to your doctor.  Keep all follow-up visits as told by your doctor. This is important. Contact a doctor if:  You have vaginal bleeding at any time while you are pregnant.  You have cramps.  You have a fever. Get help right away if:  You have very bad cramps in your back or belly (abdomen).  You pass large clots or a lot of tissue from your vagina.  Your bleeding gets worse.  You feel light-headed.  You feel weak.  You pass out (faint).  You have chills.  You are leaking fluid from your vagina.  You have a gush of fluid from your vagina. Summary  Sometimes vaginal bleeding during pregnancy is normal and does not  cause problems. At other times, bleeding may be a sign of something serious.  Tell your doctor about any bleeding from your vagina right away.  Follow your doctor's instructions about how active you can be. You may need someone to help you with your normal activities. This information is not intended to replace advice given to you by your health care provider. Make sure you discuss any questions you have with your health care provider. Document Revised: 11/06/2018 Document Reviewed: 10/19/2016 Elsevier Patient Education  2020 [employer].  Please be sure medication list is accurate. If a new problem present, please set up appointment sooner than planned today.

## 2019-11-04 NOTE — Progress Notes (Signed)
Chief Complaint  Patient presents with  . Positive pregnancy test    took test on Saturday  . Vaginal Bleeding    started yesterday   HPI: Veronica Fisher is a 44 y.o. female, who is here today with her husband complaining of intermittent vaginal spotting   LMP 09/29/19, 3 weeks later she has some vaginal spotting. Home pregnancy test positive at that time. Started bleeding again this weekends, pregnancy test still positive. Bleeding is mild, about 3 tampons in a day.  Lower abdominal cramps,radiated to lower back. Denies dysuria,increased urinary frequency, gross hematuria,or decreased urine output.  +Breast tenderness.  Negative for fever,chills,or changes in bowel habits.  She discontinue OCP's a few weeks ago.  HTN: She is on Labetalol 100 mg bid. Negative for evere/frequent headache, visual changes, chest pain, dyspnea, palpitation,focal weakness, or edema.  Lab Results  Component Value Date   CREATININE 0.62 07/20/2018   BUN 9 07/20/2018   NA 140 07/20/2018   K 4.8 07/20/2018   CL 103 07/20/2018   CO2 28 07/20/2018   Anxiety: She is on Sertraline 25 mg daily. Tolerating medication well.  Migraine: She is on Amitriptyline 25 mg 1/2 tab daily, which has helped with headaches. She has not needed Relpax 20 mg.  Review of Systems  Constitutional: Negative for activity change, appetite change, fatigue and fever.  HENT: Negative for mouth sores, nosebleeds and sore throat.   Respiratory: Negative for cough and wheezing.   Gastrointestinal: Negative for nausea and vomiting.       Negative for changes in bowel habits.  Genitourinary: Negative for difficulty urinating and vaginal discharge.  Musculoskeletal: Negative for arthralgias and myalgias.  Skin: Negative for pallor and rash.  Neurological: Negative for syncope and facial asymmetry.  Rest see pertinent positives and negatives per HPI.   Current Outpatient Medications on File Prior to Visit    Medication Sig Dispense Refill  . Cholecalciferol (VITAMIN D3) 50 MCG (2000 UT) TABS Take by mouth. 1 tablet twice a week    . hydroquinone 4 % cream Apply topically 2 (two) times daily. 28.35 g 6  . labetalol (NORMODYNE) 100 MG tablet TAKE 1 TABLET BY MOUTH TWICE A DAY 180 tablet 1  . Multiple Vitamin (MULTIVITAMIN) tablet Take 1 tablet by mouth. 1 tablet twice a week    . Omega-3 Fatty Acids (OMEGA 3 PO) Take 100 mg by mouth daily.    . sertraline (ZOLOFT) 25 MG tablet TAKE 1/2 TO 1 TABLET EVERY DAY 90 tablet 2  . nystatin-triamcinolone (MYCOLOG II) cream Apply 1 application topically 2 (two) times daily as needed. (Patient not taking: Reported on 11/04/2019) 45 g 1  . SPRINTEC 28 0.25-35 MG-MCG tablet TAKE 1 TABLET BY MOUTH EVERY DAY (Patient not taking: Reported on 11/04/2019) 84 tablet 1  . tretinoin (RETIN-A) 0.1 % cream Apply topically at bedtime. (Patient not taking: Reported on 11/04/2019) 45 g 6   No current facility-administered medications on file prior to visit.     Past Medical History:  Diagnosis Date  . Anxiety   . Arthritis   . Hypertension   . Migraines    No Known Allergies  Social History   Socioeconomic History  . Marital status: Married    Spouse name: Not on file  . Number of children: Not on file  . Years of education: Not on file  . Highest education level: Not on file  Occupational History  . Not on file  Tobacco Use  .  Smoking status: Never Smoker  . Smokeless tobacco: Never Used  Substance and Sexual Activity  . Alcohol use: Not Currently  . Drug use: Never  . Sexual activity: Yes    Partners: Male  Other Topics Concern  . Not on file  Social History Narrative  . Not on file   Social Determinants of Health   Financial Resource Strain:   . Difficulty of Paying Living Expenses:   Food Insecurity:   . Worried About Programme researcher, broadcasting/film/video in the Last Year:   . Barista in the Last Year:   Transportation Needs:   . Freight forwarder  (Medical):   Marland Kitchen Lack of Transportation (Non-Medical):   Physical Activity:   . Days of Exercise per Week:   . Minutes of Exercise per Session:   Stress:   . Feeling of Stress :   Social Connections:   . Frequency of Communication with Friends and Family:   . Frequency of Social Gatherings with Friends and Family:   . Attends Religious Services:   . Active Member of Clubs or Organizations:   . Attends Banker Meetings:   Marland Kitchen Marital Status:     Vitals:   11/04/19 1541  BP: 110/76  Pulse: 94  Resp: 12  Temp: (!) 97.5 F (36.4 C)  SpO2: 97%   Body mass index is 25.31 kg/m.  Physical Exam  Nursing note and vitals reviewed. Constitutional: She is oriented to person, place, and time. She appears well-developed and well-nourished. No distress.  HENT:  Head: Normocephalic and atraumatic.  Eyes: Pupils are equal, round, and reactive to light. Conjunctivae are normal.  Cardiovascular: Normal rate and regular rhythm.  No murmur heard. Pulses:      Dorsalis pedis pulses are 2+ on the right side and 2+ on the left side.  Respiratory: Effort normal and breath sounds normal. No respiratory distress.  GI: Soft. She exhibits no mass. There is no hepatomegaly. There is no abdominal tenderness.  Genitourinary:    Genitourinary Comments: Deferred to ob/gyn.   Musculoskeletal:        General: No edema.  Neurological: She is alert and oriented to person, place, and time. She has normal strength. No cranial nerve deficit. Gait normal.  Skin: Skin is warm. No rash noted. No erythema.  Psychiatric: She has a normal mood and affect.  Well groomed, good eye contact.    ASSESSMENT AND PLAN:  Veronica Fisher was seen today for positive pregnancy test and vaginal bleeding.  Diagnoses and all orders for this visit:  Lab Results  Component Value Date   WBC 8.4 11/04/2019   HGB 13.5 11/04/2019   HCT 40.0 11/04/2019   MCV 87.7 11/04/2019   PLT 238.0 11/04/2019    Vaginal bleeding  affecting early pregnancy -     HCG, Quant, Pregnancy -     ABO/Rh; Future -     US Pelvic Complete With Transvaginal; Future -     CBC -     Ambulatory referral to Obstetrics / Gynecology  [redacted] weeks gestation of pregnancy -     HCG, Quant, Pregnancy -     ABO/Rh; Future -     US Pelvic Complete With Transvaginal; Future -     CBC -     Ambulatory referral to Obstetrics / Gynecology   We discussed possible etiologies and prognosis. We tried to arrange pelvic US for today but no availability. Ob/gyn appt will be arranged, it seems to  be the faster route and she could be seen tomorrow. Rest,adequate hydration,no intercourse or tampons among other recommendations.  She is hemodynamically stable,I do not think she needs to go to the ER but she was clearly instructed about warning signs. She was clearly instructed about warning signs and voices understanding.  GAD (generalized anxiety disorder) Problem is well controlled with Sertraline 25 mg daily. We discussed some side effects and possible effects of fetal harm. Risk vs benefits discussed. Medication can be weaned off before 2nd trimeter or before if consider appropriate.  Migraine headache with aura Problem is well controlled. Problem may improved during pregnancy. Recommend weaning off Amitriptyline.  Benign essential hypertension BP adequately controlled. Continue Labetalol 100 mg bid and low salt diet. Instructed about warning signs.   Return Will follow with ob/gyn.Marland Kitchen    Karter Hellmer G. [place], MD  [facility]. [place] office.

## 2019-11-05 ENCOUNTER — Ambulatory Visit: Payer: BC Managed Care – PPO | Admitting: Family Medicine

## 2019-11-05 ENCOUNTER — Telehealth: Payer: Self-pay | Admitting: Family Medicine

## 2019-11-05 LAB — CBC
HCT: 40 % (ref 36.0–46.0)
Hemoglobin: 13.5 g/dL (ref 12.0–15.0)
MCHC: 33.8 g/dL (ref 30.0–36.0)
MCV: 87.7 fl (ref 78.0–100.0)
Platelets: 238 10*3/uL (ref 150.0–400.0)
RBC: 4.56 Mil/uL (ref 3.87–5.11)
RDW: 12.7 % (ref 11.5–15.5)
WBC: 8.4 10*3/uL (ref 4.0–10.5)

## 2019-11-05 LAB — HCG, QUANTITATIVE, PREGNANCY: Quantitative HCG: 667.03 m[IU]/mL

## 2019-11-05 NOTE — Telephone Encounter (Signed)
Pt is requesting to speak to you regarding her visit yesterday. Pt is requesting a referral for an ultrasound and pt has tried to schedule an appt with an OBGYN and can't get in this week. Pt is pregnant.  Thanks

## 2019-11-05 NOTE — Telephone Encounter (Signed)
Spoke with patient and she stated that the earliest that she could get into the OBGYN office is next Monday. Patient wanted to know if she could get an ultrasound done somewhere this week. Patient also inquired about lab results. Please advise.

## 2019-11-05 NOTE — Telephone Encounter (Signed)
Already discussed lab results with patient. She is coming tomorrow for blood work, lab orders were already placed. Continue having vaginal spotting,pelvic crams,and back pain but symptoms are not getting worse.  Because appointment with gynecologist is next week, she would like to have pelvic US ASAP. Thanks, BJ

## 2019-11-06 ENCOUNTER — Telehealth: Payer: Self-pay | Admitting: Family Medicine

## 2019-11-06 ENCOUNTER — Other Ambulatory Visit: Payer: Self-pay

## 2019-11-06 ENCOUNTER — Ambulatory Visit (HOSPITAL_COMMUNITY)
Admission: RE | Admit: 2019-11-06 | Discharge: 2019-11-06 | Disposition: A | Discharge status: Home / Self Care | Payer: BC Managed Care – PPO | Source: Ambulatory Visit | Attending: Family Medicine | Admitting: Family Medicine

## 2019-11-06 ENCOUNTER — Other Ambulatory Visit: Payer: Self-pay | Admitting: *Deleted

## 2019-11-06 ENCOUNTER — Other Ambulatory Visit (INDEPENDENT_AMBULATORY_CARE_PROVIDER_SITE_OTHER): Payer: BC Managed Care – PPO

## 2019-11-06 DIAGNOSIS — O209 Hemorrhage in early pregnancy, unspecified: Secondary | ICD-10-CM

## 2019-11-06 DIAGNOSIS — Z3A01 Less than 8 weeks gestation of pregnancy: Secondary | ICD-10-CM

## 2019-11-06 LAB — HCG, QUANTITATIVE, PREGNANCY: Quantitative HCG: 671.26 m[IU]/mL

## 2019-11-06 NOTE — Telephone Encounter (Signed)
Message sent to PCP for review

## 2019-11-06 NOTE — Telephone Encounter (Addendum)
Rosey Bath from the [facility] stated there is no entry utter digestion identified. Findings are consistent with pregnancy at a known location. Differentiate diag early ectopic pregnancy, too early to visualize, spontaneous subortion and ectopic pregnancy.  Follow up ultra sound is recommend to differentially exclude ectopic pregnancy.  Two small fibroids   No instruction for pt to stay-pt has left

## 2019-11-06 NOTE — Telephone Encounter (Signed)
Please see if you can get her in ASAP. Thanks

## 2019-11-06 NOTE — Telephone Encounter (Signed)
Pelvic US results discussed with patient. HCG quant did not increase as expected. She has an appointment with gynecology on 11/11/2019, which she will keep. She was clearly instructed about warning signs. She voices understanding and agrees with plan.  Salik Grewell [place], MD

## 2019-11-08 LAB — SPECIMEN STATUS REPORT

## 2019-11-09 LAB — ABO/RH: Rh Factor: POSITIVE

## 2019-11-12 NOTE — Telephone Encounter (Signed)
Pt said she  wanted the ultra sound done  First  does she still need the gyn because pt didn't want to make an appt until she had the ulta sound . Please advise should I send the gyn referral

## 2019-11-13 NOTE — Telephone Encounter (Signed)
Message Routed to PCP for review and approval. 

## 2019-11-13 NOTE — Telephone Encounter (Signed)
Please schedule patient for [place]. Patient already has GYN referral.

## 2019-11-13 NOTE — Telephone Encounter (Signed)
My understanding was that she already had an appointment with gynecologist this week but she wanted the ultrasound done before. Gynecology referral was already placed during last visit. Thanks, BJ

## 2019-11-15 ENCOUNTER — Other Ambulatory Visit: Payer: Self-pay | Admitting: Obstetrics and Gynecology

## 2019-11-16 ENCOUNTER — Inpatient Hospital Stay (HOSPITAL_COMMUNITY)
Admission: AD | Admit: 2019-11-16 | Discharge: 2019-11-16 | Disposition: A | Discharge status: Home / Self Care | Payer: BC Managed Care – PPO | Source: Ambulatory Visit | Attending: Obstetrics & Gynecology | Admitting: Obstetrics & Gynecology

## 2019-11-16 ENCOUNTER — Inpatient Hospital Stay (HOSPITAL_COMMUNITY): Payer: BC Managed Care – PPO

## 2019-11-16 ENCOUNTER — Encounter (HOSPITAL_COMMUNITY): Payer: Self-pay | Admitting: Obstetrics & Gynecology

## 2019-11-16 ENCOUNTER — Other Ambulatory Visit: Payer: Self-pay

## 2019-11-16 DIAGNOSIS — O26891 Other specified pregnancy related conditions, first trimester: Secondary | ICD-10-CM | POA: Insufficient documentation

## 2019-11-16 DIAGNOSIS — O3680X0 Pregnancy with inconclusive fetal viability, not applicable or unspecified: Secondary | ICD-10-CM

## 2019-11-16 DIAGNOSIS — O039 Complete or unspecified spontaneous abortion without complication: Secondary | ICD-10-CM

## 2019-11-16 DIAGNOSIS — Z3A01 Less than 8 weeks gestation of pregnancy: Secondary | ICD-10-CM | POA: Insufficient documentation

## 2019-11-16 LAB — COMPREHENSIVE METABOLIC PANEL
ALT: 17 U/L (ref 0–44)
AST: 22 U/L (ref 15–41)
Albumin: 4 g/dL (ref 3.5–5.0)
Alkaline Phosphatase: 45 U/L (ref 38–126)
Anion gap: 9 (ref 5–15)
BUN: 14 mg/dL (ref 6–20)
CO2: 30 mmol/L (ref 22–32)
Calcium: 9.9 mg/dL (ref 8.9–10.3)
Chloride: 105 mmol/L (ref 98–111)
Creatinine, Ser: 0.98 mg/dL (ref 0.44–1.00)
GFR calc Af Amer: >60 mL/min (ref >60)
GFR calc non Af Amer: >60 mL/min (ref >60)
Glucose, Bld: 98 mg/dL (ref 70–99)
Potassium: 5.2 mmol/L — ABNORMAL HIGH (ref 3.5–5.1)
Sodium: 144 mmol/L (ref 135–145)
Total Bilirubin: 0.6 mg/dL (ref 0.3–1.2)
Total Protein: 6.5 g/dL (ref 6.5–8.1)

## 2019-11-16 LAB — CBC
HCT: 39.8 % (ref 36.0–46.0)
Hemoglobin: 13.4 g/dL (ref 12.0–15.0)
MCH: 29.3 pg (ref 26.0–34.0)
MCHC: 33.7 g/dL (ref 30.0–36.0)
MCV: 86.9 fL (ref 80.0–100.0)
Platelets: 210 10*3/uL (ref 150–400)
RBC: 4.58 MIL/uL (ref 3.87–5.11)
RDW: 12 % (ref 11.5–15.5)
WBC: 7.8 10*3/uL (ref 4.0–10.5)
nRBC: 0 % (ref 0.0–0.2)

## 2019-11-16 LAB — HCG, QUANTITATIVE, PREGNANCY: hCG, Beta Chain, Quant, S: 48 m[IU]/mL — ABNORMAL HIGH (ref <5)

## 2019-11-16 LAB — URINALYSIS, ROUTINE W REFLEX MICROSCOPIC
Bilirubin Urine: NEGATIVE
Glucose, UA: NEGATIVE mg/dL
Ketones, ur: NEGATIVE mg/dL
Leukocytes,Ua: NEGATIVE
Nitrite: NEGATIVE
Protein, ur: NEGATIVE mg/dL
Specific Gravity, Urine: 1.016 (ref 1.005–1.030)
pH: 6 (ref 5.0–8.0)

## 2019-11-16 LAB — POCT PREGNANCY, URINE: Preg Test, Ur: POSITIVE — AB

## 2019-11-16 NOTE — MAU Provider Note (Addendum)
First Provider Initiated Contact with Patient 11/16/19 1837      S [name] is a 44 y.o. G1P0 pregnant female at [redacted]w[redacted]d by LMP who presents to MAU today with complaint of none.  Patient denies pain or bleeding, but reports she was sent here by [facility] for blood work. Patient was seen on 11/04/2019 and 11/06/2019 by her PCP for vaginal bleeding. Patient's bhCG were 667.03 and 671.26 respectively. [place] on 11/06/2019 shows pregnancy of unknown location. Patient does endorse spotting over the past two weeks as well as 3-4 days of vaginal bleeding that was like a period. Patient reports she called [facility] and asked to make an appointment with Dr. Richardson Dopp, but reports she has not yet had an appointment. UPT + in MAU. Given abnormal bhCG and US showing pregnancy of unknown location, in setting of continued + pregnancy test today, will perform ectopic work-up today, despite lack of pain and bleeding endorsed by patient.  O BP 114/77 (BP Location: Right Arm)   Pulse 89   Temp 98.7 F (37.1 C) (Oral)   Resp 14   Ht 5\' 3"  (1.6 m)   Wt 63.3 kg   LMP 09/29/2019   SpO2 100% Comment: room air  BMI 24.71 kg/m    Patient Vitals for the past 24 hrs:  BP Temp Temp src Pulse Resp SpO2 Height Weight  11/16/19 1816 114/77 98.7 F (37.1 C) Oral 89 14 100 % -- --  11/16/19 1814 -- -- -- -- -- -- 5\' 3"  (1.6 m) 63.3 kg   Physical Exam  Constitutional: She is oriented to person, place, and time. She appears well-developed and well-nourished. No distress.  HENT:  Head: Normocephalic and atraumatic.  Respiratory: Effort normal.  Neurological: She is alert and oriented to person, place, and time.  Skin: She is not diaphoretic.  Psychiatric: She has a normal mood and affect. Her behavior is normal. Judgment and thought content normal.   Results for orders placed or performed during the hospital encounter of 11/16/19 (from the past 24 hour(s))  Urinalysis, Routine w reflex microscopic     Status: Abnormal    Collection Time: 11/16/19  6:55 PM  Result Value Ref Range   Color, Urine YELLOW YELLOW   APPearance CLEAR CLEAR   Specific Gravity, Urine 1.016 1.005 - 1.030   pH 6.0 5.0 - 8.0   Glucose, UA NEGATIVE NEGATIVE mg/dL   Hgb urine dipstick SMALL (A) NEGATIVE   Bilirubin Urine NEGATIVE NEGATIVE   Ketones, ur NEGATIVE NEGATIVE mg/dL   Protein, ur NEGATIVE NEGATIVE mg/dL   Nitrite NEGATIVE NEGATIVE   Leukocytes,Ua NEGATIVE NEGATIVE   WBC, UA 0-5 0 - 5 WBC/hpf   Bacteria, UA RARE (A) NONE SEEN   Squamous Epithelial / LPF 0-5 0 - 5  Pregnancy, urine POC     Status: Abnormal   Collection Time: 11/16/19  6:58 PM  Result Value Ref Range   Preg Test, Ur POSITIVE (A) NEGATIVE  CBC     Status: None   Collection Time: 11/16/19  7:25 PM  Result Value Ref Range   WBC 7.8 4.0 - 10.5 K/uL   RBC 4.58 3.87 - 5.11 MIL/uL   Hemoglobin 13.4 12.0 - 15.0 g/dL   HCT 11/18/19 11/18/19 - 78.2 %   MCV 86.9 80.0 - 100.0 fL   MCH 29.3 26.0 - 34.0 pg   MCHC 33.7 30.0 - 36.0 g/dL   RDW 42.3 53.6 - 14.4 %   Platelets 210 150 -  400 K/uL   nRBC 0.0 0.0 - 0.2 %  Comprehensive metabolic panel     Status: Abnormal   Collection Time: 11/16/19  7:25 PM  Result Value Ref Range   Sodium 144 135 - 145 mmol/L   Potassium 5.2 (H) 3.5 - 5.1 mmol/L   Chloride 105 98 - 111 mmol/L   CO2 30 22 - 32 mmol/L   Glucose, Bld 98 70 - 99 mg/dL   BUN 14 6 - 20 mg/dL   Creatinine, Ser 0.98 0.44 - 1.00 mg/dL   Calcium 9.9 8.9 - 10.3 mg/dL   Total Protein 6.5 6.5 - 8.1 g/dL   Albumin 4.0 3.5 - 5.0 g/dL   AST 22 15 - 41 U/L   ALT 17 0 - 44 U/L   Alkaline Phosphatase 45 38 - 126 U/L   Total Bilirubin 0.6 0.3 - 1.2 mg/dL   GFR calc non Af Amer >60 >60 mL/min   GFR calc Af Amer >60 >60 mL/min   Anion gap 9 5 - 15  hCG, quantitative, pregnancy     Status: Abnormal   Collection Time: 11/16/19  7:25 PM  Result Value Ref Range   hCG, Beta Chain, Quant, S 48 (H) <5 mIU/mL    A Pregnant female Medical screening exam complete  -r/o  ectopic -labs and [place] pending at time of care transfer -care transferred to [facility], CNM Nugent, Gerrie Nordmann, NP  8:06 PM 11/16/2019   P -Results return as above.   -Condolences given. -Patient expresses relief with findings not being ectopic.  -Patient informed of need for follow up with obgyn for repeat lab testing and agreeable to follow up with [facility]. -Bleeding Precautions Given. -Patient without questions or concerns. -Message sent to [facility] office regarding scheduling for follow up.  --Encouraged to call or return to MAU if symptoms worsen or with the onset of new symptoms. -Discharged to home in stable condition.  Maryann Conners MSN, CNM Advanced Practice Provider, Center for [facility]

## 2019-11-16 NOTE — Discharge Instructions (Signed)

## 2019-11-16 NOTE — MAU Note (Signed)
Veronica Fisher is a 44 y.o. at [redacted]w[redacted]d here in MAU reporting: is here for repeat bhcg. States no pain but is having some spotting.  Pain score: 0/10  Vitals:   11/16/19 1816  BP: 114/77  Pulse: 89  Resp: 14  Temp: 98.7 F (37.1 C)  SpO2: 100%     Lab orders placed from triage: none

## 2019-11-26 ENCOUNTER — Ambulatory Visit: Payer: BC Managed Care – PPO | Attending: Family

## 2019-11-26 DIAGNOSIS — Z23 Encounter for immunization: Secondary | ICD-10-CM

## 2019-11-26 NOTE — Progress Notes (Signed)
   Covid-19 Vaccination Clinic  Name:  Veronica Fisher    MRN: [id number] DOB: 25-Oct-1975  11/26/2019  [name] was observed post Covid-19 immunization for 15 minutes without incident. She was provided with Vaccine Information Sheet and instruction to access the V-Safe system.   [name] was instructed to call 911 with any severe reactions post vaccine: Marland Kitchen Difficulty breathing  . Swelling of face and throat  . A fast heartbeat  . A bad rash all over body  . Dizziness and weakness   Immunizations Administered    Name Date Dose VIS Date Route   Moderna COVID-19 Vaccine 11/26/2019  3:04 PM 0.5 mL 07/2019 Intramuscular   Manufacturer: Moderna   Lot: [id number]   NDC: 47583-074-60

## 2019-12-02 ENCOUNTER — Ambulatory Visit (HOSPITAL_BASED_OUTPATIENT_CLINIC_OR_DEPARTMENT_OTHER): Payer: BC Managed Care – PPO | Admitting: Certified Nurse Midwife

## 2019-12-02 ENCOUNTER — Encounter: Payer: Self-pay | Admitting: Certified Nurse Midwife

## 2019-12-02 ENCOUNTER — Other Ambulatory Visit: Payer: Self-pay

## 2019-12-02 VITALS — BP 110/76 | HR 97 | Wt 139.5 lb

## 2019-12-02 DIAGNOSIS — Z319 Encounter for procreative management, unspecified: Secondary | ICD-10-CM | POA: Diagnosis not present

## 2019-12-02 DIAGNOSIS — O039 Complete or unspecified spontaneous abortion without complication: Secondary | ICD-10-CM

## 2019-12-02 NOTE — Progress Notes (Signed)
Pt is here for follow up after SAB. Pt reports she is no longer bleeding, denies any pain or cramping, denies fever. Repeat HCG today.

## 2019-12-02 NOTE — Progress Notes (Signed)
History:  Ms. Cozy Veale is a 44 y.o. G1P0 who presents to clinic today for follow up SAB. Patient was seen in MAU on  4/17 and diagnosed with SAB.   Patient was initially relieved that is was not an ectopic pregnancy but desires pregnancy. Was going to look into IVF prior to COVID but then did not start due to pandemic.   Patient denies vaginal bleeding, abdominal pain or cramping since last week.   The following portions of the patient's history were reviewed and updated as appropriate: allergies, current medications, family history, past medical history, social history, past surgical history and problem list.  Review of Systems:  Review of Systems  Constitutional: Negative.   Respiratory: Negative.   Cardiovascular: Negative.   Gastrointestinal: Negative.   Genitourinary: Negative.   Neurological: Negative.   Psychiatric/Behavioral: Negative.       Objective:  Physical Exam BP 110/76   Pulse 97   Wt 139 lb 8 oz (63.3 kg)   LMP 09/29/2019   Breastfeeding No   BMI 24.71 kg/m  Physical Exam HENT:     Head: Normocephalic.  Cardiovascular:     Rate and Rhythm: Normal rate and regular rhythm.  Pulmonary:     Effort: Pulmonary effort is normal. No respiratory distress.     Breath sounds: Normal breath sounds. No wheezing.  Abdominal:     General: There is no distension.     Palpations: Abdomen is soft. There is no mass.     Tenderness: There is no abdominal tenderness. There is no guarding.  Skin:    General: Skin is warm and dry.  Neurological:     Mental Status: She is alert and oriented to person, place, and time.  Psychiatric:        Mood and Affect: Mood normal.        Behavior: Behavior normal.        Thought Content: Thought content normal.    Assessment & Plan:  1. SAB (spontaneous abortion) - follow up hCG obtained today  - patient is doing well, grieving appropriately  - does not want to be on birth control, wants to conceive this year  - educated and  discussed with patient to abstain from trying to conceive for 2-3 months, patient verbalizes understanding  - Beta hCG quant (ref lab)  2. Patient desires pregnancy - Patient desires pregnancy and wants to go to IVF office  - Educated and discussed IVF offices in [place] and [place], information given to patient for Dr April Manson  - Encouraged to call office and get scheduled for consultation - Patient request FSH, LH and AMH to be drawn today so she can have baseline for her consultation - [facility] - LH - Anti mullerian hormone   Sharyon Cable, CNM 12/02/2019 4:35 PM

## 2019-12-02 NOTE — Patient Instructions (Signed)
DR. Fermin Schwab [facility] [place] [address] [place], [place] [address]. Phone: [phone] or [phone]      In Vitro Fertilization In vitro fertilization (IVF) is a series of procedures that are used to help with getting pregnant (conceiving). It can be used to help treat problems with fertility or genetics. IVF is a type of assisted reproductive technology (ART). ART refers to all treatments and procedures that combine eggs and sperm outside of the body to try to help a couple conceive. During IVF, eggs are retrieved from the ovaries and combined with sperm in a lab to fertilize the eggs. One or more of the fertilized eggs (embryos) are inserted into the uterus through the cervix. Candidates for IVF include:  People who are infertile. Infertility is when you are unable to conceive after a year of having sex regularly without using birth control. Infertility can also mean that a woman is not able to carry a pregnancy to full term.  Women who have undergone early (premature) menopause or ovarian failure.  Women who had both ovaries removed. In this case, donor eggs must be used.  Women who have damaged or blocked fallopian tubes. There is no age limit for having IVF, but it is not recommended for women who have gone through menopause (postmenopausal women). The ideal age for IVF is age 65 or younger. Women age 66 or older are often counseled to consider using donor eggs during IVF to increase the chances of success. Tell a health care provider about:  Any allergies you have.  All medicines you are taking, including vitamins, herbs, eye drops, creams, and over-the-counter medicines.  Any problems you or family members have had with anesthetic medicines.  Any blood disorders you have.  Any surgeries you have had.  Any medical conditions you have.  Previous pregnancies you have had.  Any history of drug use, smoking, or excessive  alcohol use. What are the risks? Generally, this is a safe procedure. However, problems may occur, including:  Infection.  Bleeding.  Allergic reactions to medicines.  Damage to other structures or organs.  Blood clots.  The procedure not working.  Having twins or multiples.  Increased risk of early delivery. What happens before the procedure? Before beginning a cycle of IVF, you and the sperm donor may need various tests (screenings) to make sure that IVF is right for you. You and the donor:  Must provide a complete medical history and the medical history of your families.  Will have a physical exam.  May need blood tests to check for infectious diseases, including HIV (human immunodeficiency virus). You may have other tests, such as:  Testing of your ovaries to determine the quality and quantity of your eggs.  Hormone tests and ovulation testing.  An exam of your uterus. This may be done with: ? Sonohysterogram. This exam uses sound waves sent to a computer to make real-time images of the inside of your uterus. To get the best images, a germ-free salt-water solution (sterile saline) is injected into your uterus through your vagina. ? Hysteroscopy. In this procedure, a thin, flexible tube with a tiny light and camera on the end of it (hysteroscope) is inserted through your vagina and into your uterus. The donor sperm will be taken and analyzed to check whether:  The sperm are normal.  There are enough sperm to fertilize the egg.  The sperm act normally after sexual intercourse (postcoital exam). What happens during the procedure?  IVF involves several procedures. One cycle of IVF can take about 2 weeks, and more than one cycle may be required. The steps of IVF are:  Ovarian stimulation. ? If you use your own eggs during IVF, you will begin treatment with artificial (synthetic) hormones at the start of a cycle. This treatment stimulates your ovaries to produce multiple  eggs, rather than the single egg that normally develops each month. ? Multiple eggs are needed because some eggs will not fertilize or will not develop normally after fertilization.  Egg retrieval. ? Using ultrasound images as a guide, a thin needle will be inserted through your vagina and into the ovary and sacs (follicles) that contain the eggs. ? The needle is connected to a suction device, which will pull the eggs and fluid out of each follicle, one at a time. ? The procedure will be repeated for the other ovary.  Insemination and fertilization. ? The sperm will be mixed together with your eggs (insemination) and stored in an environmentally controlled chamber. ? The sperm usually enters (fertilizes) an egg a few hours after insemination.  Embryo transfer. This usually takes place 2-6 days after the egg retrieval. ? The fertilized eggs (embryos) will be placed into your uterus using a thin tube (catheter). The catheter will be inserted into your vagina, through your cervix, and into your uterus. ? If successful, the embryo will stick to the lining of your uterus (implant) about 6-10 days after egg retrieval. If an embryo implants in the lining of the uterus and grows, pregnancy will result. These procedures may vary among health care providers and hospitals. What happens after the procedure?  You may need to lie down and rest for a short time.  You may continue to take hormone therapy. You may take hormone therapy for up to 3 months, as instructed by your health care provider. Summary  In vitro fertilization (IVF) is a series of procedures that are used to help with getting pregnant (conceiving).  Before beginning a cycle of IVF, you and the sperm donor may need various tests (screenings) to make sure that IVF is right for you.  IVF involves several procedures. One cycle of IVF can take about 2 weeks, and more than one cycle may be required. This information is not intended to replace  advice given to you by your health care provider. Make sure you discuss any questions you have with your health care provider. Document Revised: 06/30/2017 Document Reviewed: 04/18/2016 Elsevier Patient Education  2020 [employer].

## 2019-12-05 LAB — LUTEINIZING HORMONE: LH: 17.1 m[IU]/mL

## 2019-12-05 LAB — ANTI MULLERIAN HORMONE: ANTI-MULLERIAN HORMONE (AMH): 0.029 ng/mL

## 2019-12-05 LAB — FOLLICLE STIMULATING HORMONE: FSH: 17.2 m[IU]/mL

## 2019-12-05 LAB — BETA HCG QUANT (REF LAB): hCG Quant: <1 m[IU]/mL

## 2020-01-27 ENCOUNTER — Telehealth: Payer: Self-pay | Admitting: Family Medicine

## 2020-01-27 ENCOUNTER — Other Ambulatory Visit: Payer: Self-pay

## 2020-01-27 DIAGNOSIS — Z1231 Encounter for screening mammogram for malignant neoplasm of breast: Secondary | ICD-10-CM

## 2020-01-27 NOTE — Telephone Encounter (Signed)
Okay for mammogram order? 

## 2020-01-27 NOTE — Telephone Encounter (Signed)
The patient needs an order for her mammogram so she can have her physical on July 9th with [place] and go ahead and schedule her mammogram to hopefully have it done the same day.

## 2020-01-28 NOTE — Addendum Note (Signed)
Addended by: Kathreen Devoid on: 01/28/2020 08:40 AM   Modules accepted: Orders

## 2020-01-28 NOTE — Telephone Encounter (Signed)
It is ok to order mammogram. Thanks, BJ

## 2020-01-28 NOTE — Telephone Encounter (Signed)
Order placed, they will contact pt to schedule. 

## 2020-02-05 ENCOUNTER — Other Ambulatory Visit: Payer: Self-pay

## 2020-02-05 ENCOUNTER — Ambulatory Visit
Admission: RE | Admit: 2020-02-05 | Discharge: 2020-02-05 | Disposition: A | Discharge status: Home / Self Care | Payer: BC Managed Care – PPO | Source: Ambulatory Visit | Attending: Family Medicine | Admitting: Family Medicine

## 2020-02-05 DIAGNOSIS — Z1231 Encounter for screening mammogram for malignant neoplasm of breast: Secondary | ICD-10-CM

## 2020-02-07 ENCOUNTER — Encounter: Payer: Self-pay | Admitting: Family Medicine

## 2020-02-07 ENCOUNTER — Ambulatory Visit (INDEPENDENT_AMBULATORY_CARE_PROVIDER_SITE_OTHER): Payer: BC Managed Care – PPO | Admitting: Family Medicine

## 2020-02-07 ENCOUNTER — Other Ambulatory Visit: Payer: Self-pay

## 2020-02-07 VITALS — BP 110/60 | HR 85 | Temp 98.1°F | Ht 63.0 in | Wt 135.0 lb

## 2020-02-07 DIAGNOSIS — I10 Essential (primary) hypertension: Secondary | ICD-10-CM

## 2020-02-07 DIAGNOSIS — R040 Epistaxis: Secondary | ICD-10-CM

## 2020-02-07 DIAGNOSIS — Z0000 Encounter for general adult medical examination without abnormal findings: Secondary | ICD-10-CM | POA: Diagnosis not present

## 2020-02-07 DIAGNOSIS — Z13220 Encounter for screening for lipoid disorders: Secondary | ICD-10-CM

## 2020-02-07 DIAGNOSIS — F411 Generalized anxiety disorder: Secondary | ICD-10-CM

## 2020-02-07 DIAGNOSIS — Z23 Encounter for immunization: Secondary | ICD-10-CM | POA: Diagnosis not present

## 2020-02-07 DIAGNOSIS — Z1159 Encounter for screening for other viral diseases: Secondary | ICD-10-CM | POA: Diagnosis not present

## 2020-02-07 DIAGNOSIS — Z0184 Encounter for antibody response examination: Secondary | ICD-10-CM

## 2020-02-07 DIAGNOSIS — Z1329 Encounter for screening for other suspected endocrine disorder: Secondary | ICD-10-CM

## 2020-02-07 DIAGNOSIS — Z13228 Encounter for screening for other metabolic disorders: Secondary | ICD-10-CM

## 2020-02-07 DIAGNOSIS — Z130 Encounter for screening for diseases of the blood and blood-forming organs and certain disorders involving the immune mechanism: Secondary | ICD-10-CM

## 2020-02-07 MED ORDER — LABETALOL HCL 100 MG PO TABS: 100.0000 mg | ORAL_TABLET | Freq: Two times a day (BID) | ORAL | 2 refills | Status: DC

## 2020-02-07 NOTE — Patient Instructions (Signed)
Today you have you routine preventive visit. A few things to remember from today's visit:   Routine general medical examination at a health care facility  Benign essential hypertension - Plan: TSH  GAD (generalized anxiety disorder)  Encounter for HCV screening test for low risk patient - Plan: Hepatitis C antibody screen  Epistaxis - Plan: Ambulatory referral to ENT  Screening for lipoid disorders - Plan: Lipid panel  Screening for endocrine, metabolic and immunity disorder - Plan: Basic metabolic panel, Hemoglobin A1c  Immunity status testing - Plan: Hepatitis B surface antibody,qualitative, Measles/Mumps/Rubella Immunity  If you need refills please call your pharmacy. Do not use My Chart to request refills or for acute issues that need immediate attention.    Please be sure medication list is accurate. If a new problem present, please set up appointment sooner than planned today.  At least 150 minutes of moderate exercise per week, daily brisk walking for 15-30 min is a good exercise option. Healthy diet low in saturated (animal) fats and sweets and consisting of fresh fruits and vegetables, lean meats such as fish and white chicken and whole grains.  These are some of recommendations for screening depending of age and risk factors:  - Vaccines:  Tdap vaccine every 10 years.  Shingles vaccine recommended at age 34, could be given after 45 years of age but not sure about insurance coverage.   Pneumonia vaccines: Pneumovax at 65. Sometimes Pneumovax is giving earlier if history of smoking, lung disease,diabetes,kidney disease among some.  Screening for diabetes at age 30 and every 3 years.  Cervical cancer prevention:  Pap smear starts at 45 years of age and continues periodically until 45 years old in low risk women. Pap smear every 3 years between 57 and 74 years old. Pap smear every 3-5 years between women 30 and older if pap smear negative and HPV screening  negative.   -Breast cancer: Mammogram: There is disagreement between experts about when to start screening in low risk asymptomatic female but recent recommendations are to start screening at 37 and not later than 45 years old , every 1-2 years and after 45 yo q 2 years. Screening is recommended until 45 years old but some women can continue screening depending of healthy issues.  Colon cancer screening: Has been recently changed to 45 yo. Insurance may not cover until you are 45 years old. Screening is recommended until 45 years old.  Cholesterol disorder screening at age 28 and every 3 years.  Also recommended:  1. Dental visit- Brush and floss your teeth twice daily; visit your dentist twice a year. 2. Eye doctor- Get an eye exam at least every 2 years. 3. Helmet use- Always wear a helmet when riding a bicycle, motorcycle, rollerblading or skateboarding. 4. Safe sex- If you may be exposed to sexually transmitted infections, use a condom. 5. Seat belts- Seat belts can save your live; always wear one. 6. Smoke/Carbon Monoxide detectors- These detectors need to be installed on the appropriate level of your home. Replace batteries at least once a year. 7. Skin cancer- When out in the sun please cover up and use sunscreen 15 SPF or higher. 8. Violence- If anyone is threatening or hurting you, please tell your healthcare provider.  9. Drink alcohol in moderation- Limit alcohol intake to one drink or less per day. Never drink and drive. 10. Calcium supplementation 1000 to 1200 mg daily, ideally through your diet.  Vitamin D supplementation 800 units daily.

## 2020-02-07 NOTE — Progress Notes (Signed)
HPI: Veronica Fisher is a 44 y.o. female, who is here today for her routine physical.  Last CPE: A few years ago.  Regular exercise 3 or more time per week: She is swimming,running,and going to the gym. Following a healthy diet: Yes. She lives with her husband.  Chronic medical problems: HTN,anxiety,migraine headache, and arthralgia among some.  Pap smear:2019. Hx of abnormal pap smears: Negative. G:1 A:1. Miscarriage recently, 10/2019. Evaluated by gyn, seen on 12/02/19. Veronica Fisher has an appt with infertility provider in 04/2020.She is having intermittent vaginal bleeding. L:0  Immunization History  Administered Date(s) Administered  . Influenza,inj,Quad PF,6+ Mos 05/30/2018  . Moderna SARS-COVID-2 Vaccination 10/24/2019, 11/26/2019   Mammogram: 02/05/20 pending result. Colonoscopy: N/A DEXA: N/A  Hep C screening: Never.  HTN: She is on Labetalol 100 mg bid.  Migraine: Stopped Amitriptyline.  Anxiety: Taking Sertraline 25 mg 1/2 tab daily, trying to weak medication.Headache no more than usual.  Nose bleeding: Requesting referral to ENT. She was already evaluated by ENT for this problem but she would like to see another provider. Mild nose bleed, exacerbated by blowing nose. She is using intranasal steroid.  She needs immunizations up to date for immigration. She has all childhood vaccines. Last Tdap over 10 years ago.  Review of Systems  Constitutional: Negative for appetite change, fatigue and fever.  HENT: Negative for dental problem, hearing loss, mouth sores, sore throat and trouble swallowing.   Eyes: Negative for redness and visual disturbance.  Respiratory: Negative for cough, shortness of breath and wheezing.   Cardiovascular: Negative for chest pain and leg swelling.  Gastrointestinal: Negative for abdominal pain, nausea and vomiting.       No changes in bowel habits.  Endocrine: Negative for cold intolerance, heat intolerance, polydipsia, polyphagia and  polyuria.  Genitourinary: Positive for vaginal bleeding. Negative for decreased urine volume, dysuria, hematuria and vaginal discharge.  Musculoskeletal: Negative for gait problem and myalgias.  Skin: Negative for color change and rash.  Allergic/Immunologic: Positive for environmental allergies.  Neurological: Negative for syncope, facial asymmetry and weakness.  Hematological: Negative for adenopathy. Does not bruise/bleed easily.  Psychiatric/Behavioral: Negative for confusion and sleep disturbance.  All other systems reviewed and are negative.  Current Outpatient Medications on File Prior to Visit  Medication Sig Dispense Refill  . Cholecalciferol (VITAMIN D3) 50 MCG (2000 UT) TABS Take by mouth. 1 tablet twice a week    . hydroquinone 4 % cream Apply topically 2 (two) times daily. 28.35 g 6  . Multiple Vitamin (MULTIVITAMIN) tablet Take 1 tablet by mouth. 1 tablet twice a week    . Omega-3 Fatty Acids (OMEGA 3 PO) Take 100 mg by mouth daily.    . sertraline (ZOLOFT) 25 MG tablet TAKE 1/2 TO 1 TABLET EVERY DAY 90 tablet 2   No current facility-administered medications on file prior to visit.     Past Medical History:  Diagnosis Date  . Anxiety   . Arthritis   . Hypertension   . Migraines     History reviewed. No pertinent surgical history.  No Known Allergies  Family History  Problem Relation Age of Onset  . Arthritis Mother   . Hypertension Mother     Social History   Socioeconomic History  . Marital status: Married    Spouse name: Not on file  . Number of children: Not on file  . Years of education: Not on file  . Highest education level: Not on file  Occupational History  . Not  on file  Tobacco Use  . Smoking status: Never Smoker  . Smokeless tobacco: Never Used  Vaping Use  . Vaping Use: Never used  Substance and Sexual Activity  . Alcohol use: Not Currently  . Drug use: Never  . Sexual activity: Yes    Partners: Male  Other Topics Concern  . Not  on file  Social History Narrative  . Not on file   Social Determinants of Health   Financial Resource Strain:   . Difficulty of Paying Living Expenses:   Food Insecurity:   . Worried About Programme researcher, broadcasting/film/videounning Out of Food in the Last Year:   . Baristaan Out of Food in the Last Year:   Transportation Needs:   . Freight forwarderLack of Transportation (Medical):   Marland Kitchen. Lack of Transportation (Non-Medical):   Physical Activity:   . Days of Exercise per Week:   . Minutes of Exercise per Session:   Stress:   . Feeling of Stress :   Social Connections:   . Frequency of Communication with Friends and Family:   . Frequency of Social Gatherings with Friends and Family:   . Attends Religious Services:   . Active Member of Clubs or Organizations:   . Attends BankerClub or Organization Meetings:   Marland Kitchen. Marital Status:      Vitals:   02/07/20 1232  BP: 110/60  Pulse: 85  Temp: 98.1 F (36.7 C)  SpO2: 98%   Body mass index is 23.91 kg/m.   Wt Readings from Last 3 Encounters:  02/07/20 135 lb (61.2 kg)  12/02/19 139 lb 8 oz (63.3 kg)  11/16/19 139 lb 8 oz (63.3 kg)      Physical Exam Vitals and nursing note reviewed.  Constitutional:      General: She is not in acute distress.    Appearance: She is well-developed.  HENT:     Head: Normocephalic and atraumatic.     Right Ear: Hearing, tympanic membrane, ear canal and external ear normal.     Left Ear: Hearing, tympanic membrane, ear canal and external ear normal.     Nose: No nasal tenderness, mucosal edema or rhinorrhea.     Mouth/Throat:     Mouth: Mucous membranes are moist.     Pharynx: Oropharynx is clear. Uvula midline.  Eyes:     Conjunctiva/sclera: Conjunctivae normal.     Pupils: Pupils are equal, round, and reactive to light.  Neck:     Thyroid: No thyromegaly.     Trachea: No tracheal deviation.  Cardiovascular:     Rate and Rhythm: Normal rate and regular rhythm.     Pulses:          Dorsalis pedis pulses are 2+ on the right side and 2+ on the  left side.       Posterior tibial pulses are 2+ on the right side and 2+ on the left side.     Heart sounds: No murmur heard.   Pulmonary:     Effort: Pulmonary effort is normal. No respiratory distress.     Breath sounds: Normal breath sounds.  Abdominal:     Palpations: Abdomen is soft. There is no hepatomegaly or mass.     Tenderness: There is no abdominal tenderness.  Genitourinary:    Comments: Prefers to hold on pap smear. Musculoskeletal:     Comments: No signs of synovitis appreciated.  Lymphadenopathy:     Cervical: No cervical adenopathy.     Upper Body:     Right upper body:  No supraclavicular adenopathy.     Left upper body: No supraclavicular adenopathy.  Skin:    General: Skin is warm.     Findings: No erythema or rash.  Neurological:     General: No focal deficit present.     Mental Status: She is alert and oriented to person, place, and time.     Cranial Nerves: No cranial nerve deficit.     Coordination: Coordination normal.     Gait: Gait normal.     Deep Tendon Reflexes:     Reflex Scores:      Bicep reflexes are 2+ on the right side and 2+ on the left side.      Patellar reflexes are 2+ on the right side and 2+ on the left side. Psychiatric:        Mood and Affect: Mood and affect normal.        Speech: Speech normal.     Comments: Well groomed, good eye contact.    ASSESSMENT AND PLAN:  Dr. Marily Lente was here today annual physical examination.  Orders Placed This Encounter  Procedures  . Tdap vaccine greater than or equal to 7yo IM  . Hepatitis B surface antibody,qualitative  . Basic metabolic panel  . Hemoglobin A1c  . Hepatitis C antibody screen  . Lipid panel  . Measles/Mumps/Rubella Immunity  . TSH  . Ambulatory referral to ENT   Lab Results  Component Value Date   CHOL 188 02/07/2020   HDL 63 02/07/2020   LDLCALC 106 (H) 02/07/2020   TRIG 93 02/07/2020   CHOLHDL 3.0 02/07/2020   Lab Results  Component Value Date   HGBA1C  4.8 02/07/2020   Lab Results  Component Value Date   CREATININE 0.63 02/07/2020   BUN 10 02/07/2020   NA 138 02/07/2020   K 4.0 02/07/2020   CL 101 02/07/2020   CO2 26 02/07/2020   Routine general medical examination at a health care facility We discussed the importance of regular physical activity and healthy diet for prevention of chronic illness and/or complications. Preventive guidelines reviewed. Vaccination updated.  Next CPE in a year.  Benign essential hypertension BP adequately controlled. No changes in current management. Continue low salt diet.  -     labetalol (NORMODYNE) 100 MG tablet; Take 1 tablet (100 mg total) by mouth 2 (two) times daily.  GAD (generalized anxiety disorder) Continue Sertraline 25 mg 1/2 tab daily.  Encounter for HCV screening test for low risk patient -     Cancel: Hepatitis C antibody screen -     Hepatitis C antibody screen  Epistaxis Nasal steroid could aggravate problem,'Nasal irrigations with saline. [place] on septum may help with dry nasal mucosa. ENT referral placed.  Screening for lipoid disorders -     Cancel: Lipid panel -     Lipid panel  Screening for endocrine, metabolic and immunity disorder -     Basic metabolic panel -     Hemoglobin A1c  Immunity status testing -     Hepatitis B surface antibody,qualitative -     Measles/Mumps/Rubella Immunity  Need for Tdap vaccination -     Tdap vaccine greater than or equal to 7yo IM   Return in 1 year (on 02/06/2021).   Glessie Eustice G. [place], MD  [facility]. [place] [facility].   Today you have you routine preventive visit. A few things to remember from today's visit:   Routine general medical examination at a health care facility  Benign  essential hypertension - Plan: TSH  GAD (generalized anxiety disorder)  Encounter for HCV screening test for low risk patient - Plan: Hepatitis C antibody screen  Epistaxis - Plan: Ambulatory referral to ENT  Screening for  lipoid disorders - Plan: Lipid panel  Screening for endocrine, metabolic and immunity disorder - Plan: Basic metabolic panel, Hemoglobin A1c  Immunity status testing - Plan: Hepatitis B surface antibody,qualitative, Measles/Mumps/Rubella Immunity  If you need refills please call your pharmacy. Do not use My Chart to request refills or for acute issues that need immediate attention.    Please be sure medication list is accurate. If a new problem present, please set up appointment sooner than planned today.  At least 150 minutes of moderate exercise per week, daily brisk walking for 15-30 min is a good exercise option. Healthy diet low in saturated (animal) fats and sweets and consisting of fresh fruits and vegetables, lean meats such as fish and white chicken and whole grains.  These are some of recommendations for screening depending of age and risk factors:  - Vaccines:  Tdap vaccine every 10 years.  Shingles vaccine recommended at age 65, could be given after 45 years of age but not sure about insurance coverage.   Pneumonia vaccines: Pneumovax at 65. Sometimes Pneumovax is giving earlier if history of smoking, lung disease,diabetes,kidney disease among some.  Screening for diabetes at age 59 and every 3 years.  Cervical cancer prevention:  Pap smear starts at 45 years of age and continues periodically until 45 years old in low risk women. Pap smear every 3 years between 75 and 2 years old. Pap smear every 3-5 years between women 30 and older if pap smear negative and HPV screening negative.   -Breast cancer: Mammogram: There is disagreement between experts about when to start screening in low risk asymptomatic female but recent recommendations are to start screening at 7 and not later than 45 years old , every 1-2 years and after 45 yo q 2 years. Screening is recommended until 45 years old but some women can continue screening depending of healthy issues.  Colon cancer  screening: Has been recently changed to 45 yo. Insurance may not cover until you are 45 years old. Screening is recommended until 45 years old.  Cholesterol disorder screening at age 4 and every 3 years.  Also recommended:  1. Dental visit- Brush and floss your teeth twice daily; visit your dentist twice a year. 2. Eye doctor- Get an eye exam at least every 2 years. 3. Helmet use- Always wear a helmet when riding a bicycle, motorcycle, rollerblading or skateboarding. 4. Safe sex- If you may be exposed to sexually transmitted infections, use a condom. 5. Seat belts- Seat belts can save your live; always wear one. 6. Smoke/Carbon Monoxide detectors- These detectors need to be installed on the appropriate level of your home. Replace batteries at least once a year. 7. Skin cancer- When out in the sun please cover up and use sunscreen 15 SPF or higher. 8. Violence- If anyone is threatening or hurting you, please tell your healthcare provider.  9. Drink alcohol in moderation- Limit alcohol intake to one drink or less per day. Never drink and drive. 10. Calcium supplementation 1000 to 1200 mg daily, ideally through your diet.  Vitamin D supplementation 800 units daily.

## 2020-02-10 LAB — MEASLES/MUMPS/RUBELLA IMMUNITY
Mumps IgG: 243 AU/mL
Rubella: >33 Index
Rubeola IgG: >300 AU/mL

## 2020-02-10 LAB — BASIC METABOLIC PANEL
BUN: 10 mg/dL (ref 7–25)
CO2: 26 mmol/L (ref 20–32)
Calcium: 9.6 mg/dL (ref 8.6–10.2)
Chloride: 101 mmol/L (ref 98–110)
Creat: 0.63 mg/dL (ref 0.50–1.10)
Glucose, Bld: 84 mg/dL (ref 65–99)
Potassium: 4 mmol/L (ref 3.5–5.3)
Sodium: 138 mmol/L (ref 135–146)

## 2020-02-10 LAB — LIPID PANEL
Cholesterol: 188 mg/dL (ref <200)
HDL: 63 mg/dL (ref >=50)
LDL Cholesterol (Calc): 106 mg/dL (calc) — ABNORMAL HIGH
Non-HDL Cholesterol (Calc): 125 mg/dL (calc) (ref <130)
Total CHOL/HDL Ratio: 3 (calc) (ref <5.0)
Triglycerides: 93 mg/dL (ref <150)

## 2020-02-10 LAB — HEPATITIS C ANTIBODY
Hepatitis C Ab: NONREACTIVE
SIGNAL TO CUT-OFF: 0.01 (ref <1.00)

## 2020-02-10 LAB — HEPATITIS B SURFACE ANTIBODY,QUALITATIVE: Hep B S Ab: REACTIVE — AB

## 2020-02-10 LAB — TSH: TSH: 1.76 mIU/L

## 2020-02-10 LAB — HEMOGLOBIN A1C
Hgb A1c MFr Bld: 4.8 % of total Hgb (ref <5.7)
Mean Plasma Glucose: 91 (calc)
eAG (mmol/L): 5 (calc)

## 2020-02-12 ENCOUNTER — Other Ambulatory Visit: Payer: Self-pay

## 2020-02-12 ENCOUNTER — Other Ambulatory Visit (HOSPITAL_COMMUNITY)
Admission: RE | Admit: 2020-02-12 | Discharge: 2020-02-12 | Disposition: A | Discharge status: Home / Self Care | Payer: BC Managed Care – PPO | Source: Ambulatory Visit | Attending: Family Medicine | Admitting: Family Medicine

## 2020-02-12 ENCOUNTER — Encounter: Payer: Self-pay | Admitting: Family Medicine

## 2020-02-12 ENCOUNTER — Ambulatory Visit (INDEPENDENT_AMBULATORY_CARE_PROVIDER_SITE_OTHER): Payer: BC Managed Care – PPO | Admitting: Family Medicine

## 2020-02-12 VITALS — BP 120/70 | HR 76 | Resp 12 | Ht 63.0 in | Wt 135.0 lb

## 2020-02-12 DIAGNOSIS — Z124 Encounter for screening for malignant neoplasm of cervix: Secondary | ICD-10-CM | POA: Insufficient documentation

## 2020-02-12 NOTE — Patient Instructions (Signed)
A few things to remember from today's visit:   Screening for malignant neoplasm of cervix - Plan: PAP [[place]] Pap smear done today, I am also ordering HPV screening. If negative , you will need pap q 5 years.  If you need refills please call your pharmacy. Do not use My Chart to request refills or for acute issues that need immediate attention.    Please be sure medication list is accurate. If a new problem present, please set up appointment sooner than planned today.

## 2020-02-12 NOTE — Addendum Note (Signed)
Addended by: [place], Jalexia Lalli G on: 02/12/2020 01:44 PM   Modules accepted: Level of Service

## 2020-02-12 NOTE — Progress Notes (Signed)
Chief Complaint  Patient presents with  . Gynecologic Exam   HPI: Veronica Fisher is a 44 y.o. female with history of anxiety, hypertension, adenitis here today for cervical cancer screening/pap smear. She was seen recently for her CPE,chose to hold on pap due to vaginal bleeding. Intermittent vaginal bleeding since miscarriage in 10/2019.  M:13 G:1 A:1 L:0 She is planning on IVF, has appt with infertility provider in 04/2020.  Last pap smear 4-5 years ago. Negative for history of abnormal Pap smear or STDs. Last mammogram on 02/05/20 [facility] 1.  She has no concerns today.  Review of Systems  Constitutional: Negative for chills, fatigue and fever.  HENT: Negative for mouth sores and nosebleeds.   Gastrointestinal: Negative for abdominal pain, nausea and vomiting.  Endocrine: Negative for cold intolerance and heat intolerance.  Genitourinary: Negative for decreased urine volume, dysuria, hematuria and pelvic pain.  Musculoskeletal: Negative for gait problem and myalgias.  Neurological: Negative for syncope and weakness.  Hematological: Negative for adenopathy. Does not bruise/bleed easily.  Rest of ROS, see pertinent positives sand negatives in HPI  Current Outpatient Medications on File Prior to Visit  Medication Sig Dispense Refill  . Cholecalciferol (VITAMIN D3) 50 MCG (2000 UT) TABS Take by mouth. 1 tablet twice a week    . hydroquinone 4 % cream Apply topically 2 (two) times daily. 28.35 g 6  . labetalol (NORMODYNE) 100 MG tablet Take 1 tablet (100 mg total) by mouth 2 (two) times daily. 180 tablet 2  . Multiple Vitamin (MULTIVITAMIN) tablet Take 1 tablet by mouth. 1 tablet twice a week    . Omega-3 Fatty Acids (OMEGA 3 PO) Take 100 mg by mouth daily.    . sertraline (ZOLOFT) 25 MG tablet TAKE 1/2 TO 1 TABLET EVERY DAY 90 tablet 2   No current facility-administered medications on file prior to visit.     Past Medical History:  Diagnosis Date  . Anxiety   .  Arthritis   . Hypertension   . Migraines    No Known Allergies  Social History   Socioeconomic History  . Marital status: Married    Spouse name: Not on file  . Number of children: Not on file  . Years of education: Not on file  . Highest education level: Not on file  Occupational History  . Not on file  Tobacco Use  . Smoking status: Never Smoker  . Smokeless tobacco: Never Used  Vaping Use  . Vaping Use: Never used  Substance and Sexual Activity  . Alcohol use: Not Currently  . Drug use: Never  . Sexual activity: Yes    Partners: Male  Other Topics Concern  . Not on file  Social History Narrative  . Not on file   Social Determinants of Health   Financial Resource Strain:   . Difficulty of Paying Living Expenses:   Food Insecurity:   . Worried About Programme researcher, broadcasting/film/video in the Last Year:   . Barista in the Last Year:   Transportation Needs:   . Freight forwarder (Medical):   Marland Kitchen Lack of Transportation (Non-Medical):   Physical Activity:   . Days of Exercise per Week:   . Minutes of Exercise per Session:   Stress:   . Feeling of Stress :   Social Connections:   . Frequency of Communication with Friends and Family:   . Frequency of Social Gatherings with Friends and Family:   . Attends Religious Services:   .  Active Member of Clubs or Organizations:   . Attends Banker Meetings:   Marland Kitchen Marital Status:    Vitals:   02/12/20 1132  BP: 120/70  Pulse: 76  Resp: 12   Body mass index is 23.91 kg/m.  Physical Exam Vitals and nursing note reviewed. Exam conducted with a chaperone present.  Constitutional:      General: She is not in acute distress.    Appearance: She is well-developed and normal weight.  HENT:     Head: Normocephalic and atraumatic.  Eyes:     Conjunctiva/sclera: Conjunctivae normal.  Cardiovascular:     Rate and Rhythm: Normal rate and regular rhythm.  Pulmonary:     Effort: Pulmonary effort is normal. No  respiratory distress.  Abdominal:     Palpations: Abdomen is soft. There is no mass.     Tenderness: There is no abdominal tenderness.  Genitourinary:    General: Normal vulva.     Vagina: No vaginal discharge, erythema, tenderness or bleeding.     Cervix: Friability present. No cervical motion tenderness or discharge.     Uterus: Not enlarged, not tender and no uterine prolapse.      Adnexa:        Right: No mass, tenderness or fullness.         Left: No mass, tenderness or fullness.       Comments: Pap sample collected. Skin:    General: Skin is warm.     Findings: No erythema or rash.  Neurological:     General: No focal deficit present.     Mental Status: She is alert and oriented to person, place, and time.     Cranial Nerves: No cranial nerve deficit.     Gait: Gait normal.  Psychiatric:     Comments: Well groomed, good eye contact.   ASSESSMENT AND PLAN:  Veronica Fisher was seen today for cervical cancer screening/Pap smear  1. Screening for malignant neoplasm of cervix Pap smear collected. We discussed current recommendations for gynecologic care. Next Pap smear to be scheduled according to results.  - PAP [[place]]  Return in about 1 year (around 02/11/2021).   Seward Coran G. [place], MD  [facility]. [place] office.

## 2020-02-14 LAB — CYTOLOGY - PAP
Comment: NEGATIVE
Diagnosis: NEGATIVE
High risk HPV: NEGATIVE

## 2020-02-16 ENCOUNTER — Other Ambulatory Visit: Payer: Self-pay | Admitting: Family Medicine

## 2020-02-16 DIAGNOSIS — G43109 Migraine with aura, not intractable, without status migrainosus: Secondary | ICD-10-CM

## 2020-02-25 ENCOUNTER — Other Ambulatory Visit: Payer: Self-pay | Admitting: Family Medicine

## 2020-02-25 DIAGNOSIS — F411 Generalized anxiety disorder: Secondary | ICD-10-CM

## 2020-05-19 ENCOUNTER — Other Ambulatory Visit: Payer: Self-pay

## 2020-05-19 ENCOUNTER — Ambulatory Visit: Payer: BC Managed Care – PPO | Admitting: Family Medicine

## 2020-05-19 ENCOUNTER — Encounter: Payer: Self-pay | Admitting: Family Medicine

## 2020-05-19 VITALS — BP 120/70 | HR 82 | Resp 12 | Ht 63.0 in | Wt 132.2 lb

## 2020-05-19 DIAGNOSIS — I10 Essential (primary) hypertension: Secondary | ICD-10-CM | POA: Diagnosis not present

## 2020-05-19 DIAGNOSIS — F411 Generalized anxiety disorder: Secondary | ICD-10-CM

## 2020-05-19 DIAGNOSIS — N959 Unspecified menopausal and perimenopausal disorder: Secondary | ICD-10-CM

## 2020-05-19 NOTE — Patient Instructions (Signed)
A few things to remember from today's visit:  Premenopausal patient - Plan: Follicle stimulating hormone, Luteinizing hormone, Estrogens, Total, Progesterone  Benign essential hypertension  GAD (generalized anxiety disorder)  No changes today but consider increasing sertraline dose to 25 mg and even 50 mg.  If you need refills please call your pharmacy. Do not use My Chart to request refills or for acute issues that need immediate attention.    Please be sure medication list is accurate. If a new problem present, please set up appointment sooner than planned today.      '

## 2020-05-19 NOTE — Progress Notes (Signed)
HPI:  [name] is a 44 y.o. female, who is here today for follow up.   She was last seen on 02/12/20 for her pap smear. Today she is requesting blood work done. She is considering infertility treatment, planning on going to [place] for a "mini IVF" evaluation. She had a miscarriage in 10/2019. According to patient, [place] F[place] she is not a good candidate for IVF.  She found out that in [place] procedure could be done. She needs FSH, LH, estrogen, and progesterone. On 12/02/2019 LH was 17.1 and FSH 17.2.  Negative for pelvic pain, vaginal discharge, vaginal bleeding, or urinary symptoms. LMP 04/30/2020, she is still having regular menstrual cycles.  She also needs an order for her husband for semen analysis, he has history of oligospermia.  Having difficulty getting pregnant has caused some depressed mood but feels better knowing that there is a possibility of undergoing IVF treatment.  She is already looking for a egg donor.  She is currently on sertraline 25 mg, taking 1/2 tablet daily for anxiety disorder. Negative for suicidal thoughts.  Hypertension: Currently she is on labetalol labetalol 100 mg twice daily.  Lab Results  Component Value Date   CREATININE 0.63 02/07/2020   BUN 10 02/07/2020   NA 138 02/07/2020   K 4.0 02/07/2020   CL 101 02/07/2020   CO2 26 02/07/2020   Negative for severe/frequent headache, visual changes, chest pain, dyspnea, palpitation, claudication, focal weakness, or edema. She has lost some wt. She is exercising regularly, swimming a couple times per week. She is also following a healthful diet.  Review of Systems  Constitutional: Negative for activity change, appetite change, fatigue and fever.  HENT: Negative for mouth sores, nosebleeds and sore throat.   Respiratory: Negative for cough and wheezing.   Gastrointestinal: Negative for abdominal pain, nausea and  vomiting.       Negative for changes in bowel habits.  Genitourinary: Negative for decreased urine volume, dysuria and hematuria.  Neurological: Negative for syncope and facial asymmetry.  Psychiatric/Behavioral: Negative for confusion. The patient is nervous/anxious.   Rest of ROS, see pertinent positives sand negatives in HPI  Current Outpatient Medications on File Prior to Visit  Medication Sig Dispense Refill  . Cholecalciferol (VITAMIN D3) 50 MCG (2000 UT) TABS Take by mouth. 1 tablet twice a week    . hydroquinone 4 % cream Apply topically 2 (two) times daily. 28.35 g 6  . labetalol (NORMODYNE) 100 MG tablet Take 1 tablet (100 mg total) by mouth 2 (two) times daily. 180 tablet 2  . Multiple Vitamin (MULTIVITAMIN) tablet Take 1 tablet by mouth. 1 tablet twice a week    . Omega-3 Fatty Acids (OMEGA 3 PO) Take 100 mg by mouth daily.    . sertraline (ZOLOFT) 25 MG tablet TAKE 1/2 TO 1 TABLET EVERY DAY 90 tablet 2   No current facility-administered medications on file prior to visit.   Past Medical History:  Diagnosis Date  . Anxiety   . Arthritis   . Hypertension   . Migraines    No Known Allergies  Social History   Socioeconomic History  . Marital status: Married    Spouse name: Not on file  . Number of children: Not on file  . Years of education: Not on file  . Highest education level: Not on file  Occupational History  . Not on file  Tobacco Use  . Smoking status:  Never Smoker  . Smokeless tobacco: Never Used  Vaping Use  . Vaping Use: Never used  Substance and Sexual Activity  . Alcohol use: Not Currently  . Drug use: Never  . Sexual activity: Yes    Partners: Male  Other Topics Concern  . Not on file  Social History Narrative  . Not on file   Social Determinants of Health   Financial Resource Strain:   . Difficulty of Paying Living Expenses: Not on file  Food Insecurity:   . Worried About Programme researcher, broadcasting/film/video in the Last Year: Not on file  . Ran Out of  Food in the Last Year: Not on file  Transportation Needs:   . Lack of Transportation (Medical): Not on file  . Lack of Transportation (Non-Medical): Not on file  Physical Activity:   . Days of Exercise per Week: Not on file  . Minutes of Exercise per Session: Not on file  Stress:   . Feeling of Stress : Not on file  Social Connections:   . Frequency of Communication with Friends and Family: Not on file  . Frequency of Social Gatherings with Friends and Family: Not on file  . Attends Religious Services: Not on file  . Active Member of Clubs or Organizations: Not on file  . Attends Banker Meetings: Not on file  . Marital Status: Not on file    Vitals:   05/19/20 1159  BP: 120/70  Pulse: 82  Resp: 12  SpO2: 98%   Body mass index is 23.43 kg/m.  Physical Exam Vitals and nursing note reviewed.  Constitutional:      General: She is not in acute distress.    Appearance: She is well-developed.  HENT:     Head: Normocephalic and atraumatic.  Eyes:     Conjunctiva/sclera: Conjunctivae normal.  Cardiovascular:     Rate and Rhythm: Normal rate and regular rhythm.     Pulses:          Dorsalis pedis pulses are 2+ on the right side and 2+ on the left side.     Heart sounds: No murmur heard.   Pulmonary:     Effort: Pulmonary effort is normal. No respiratory distress.     Breath sounds: Normal breath sounds.  Abdominal:     Palpations: Abdomen is soft. There is no hepatomegaly or mass.     Tenderness: There is no abdominal tenderness.  Lymphadenopathy:     Cervical: No cervical adenopathy.  Skin:    General: Skin is warm.     Findings: No erythema or rash.  Neurological:     General: No focal deficit present.     Mental Status: She is alert and oriented to person, place, and time.     Cranial Nerves: No cranial nerve deficit.     Gait: Gait normal.  Psychiatric:     Comments: Well groomed, good eye contact.   ASSESSMENT AND PLAN:  [name] was  seen today for follow-up.  Orders Placed This Encounter  Procedures  . Follicle stimulating hormone  . Luteinizing hormone  . Estrogens, Total  . Progesterone   Premenopausal patient Infertility, looking into IVF treatment. Lab orders placed. She will have labs done on the third day of her menstrual cycle.  Benign essential hypertension BP adequately controlled. Continue labetalol 100 mg twice daily. Continue low salt diet and monitoring BP regularly.  GAD (generalized anxiety disorder) Associated with some depressed mood. Recommend considering increasing the dose  of sertraline from 12.5 mg to 25 mg and even 50 mg daily.   Return in about 8 months (around 01/08/2021) for cpe and f/u.   Nickole Adamek G. [place], MD  [facility]. [place] office.  A few things to remember from today's visit:  Premenopausal patient - Plan: Follicle stimulating hormone, Luteinizing hormone, Estrogens, Total, Progesterone  Benign essential hypertension  GAD (generalized anxiety disorder)  No changes today but consider increasing sertraline dose to 25 mg and even 50 mg.  If you need refills please call your pharmacy. Do not use My Chart to request refills or for acute issues that need immediate attention.    Please be sure medication list is accurate. If a new problem present, please set up appointment sooner than planned today.

## 2020-06-28 ENCOUNTER — Encounter: Payer: Self-pay | Admitting: Family Medicine

## 2020-06-30 ENCOUNTER — Other Ambulatory Visit: Payer: Self-pay | Admitting: Family Medicine

## 2020-06-30 DIAGNOSIS — Z114 Encounter for screening for human immunodeficiency virus [HIV]: Secondary | ICD-10-CM

## 2020-06-30 DIAGNOSIS — Z1159 Encounter for screening for other viral diseases: Secondary | ICD-10-CM

## 2020-07-01 ENCOUNTER — Other Ambulatory Visit (INDEPENDENT_AMBULATORY_CARE_PROVIDER_SITE_OTHER): Payer: BC Managed Care – PPO

## 2020-07-01 ENCOUNTER — Other Ambulatory Visit: Payer: Self-pay

## 2020-07-01 DIAGNOSIS — N959 Unspecified menopausal and perimenopausal disorder: Secondary | ICD-10-CM

## 2020-07-01 DIAGNOSIS — Z1159 Encounter for screening for other viral diseases: Secondary | ICD-10-CM

## 2020-07-01 DIAGNOSIS — Z114 Encounter for screening for human immunodeficiency virus [HIV]: Secondary | ICD-10-CM

## 2020-07-08 LAB — ESTROGENS, TOTAL: Estrogen: 179.3 pg/mL

## 2020-07-08 LAB — FOLLICLE STIMULATING HORMONE: FSH: 8.9 m[IU]/mL

## 2020-07-08 LAB — HEPATITIS B SURFACE ANTIGEN: Hepatitis B Surface Ag: NONREACTIVE

## 2020-07-08 LAB — HIV ANTIBODY (ROUTINE TESTING W REFLEX): HIV 1&2 Ab, 4th Generation: NONREACTIVE

## 2020-07-08 LAB — PROGESTERONE: Progesterone: <0.5 ng/mL

## 2020-07-08 LAB — LUTEINIZING HORMONE: LH: 3.5 m[IU]/mL

## 2020-11-19 ENCOUNTER — Other Ambulatory Visit: Payer: Self-pay | Admitting: Family Medicine

## 2020-11-19 DIAGNOSIS — F411 Generalized anxiety disorder: Secondary | ICD-10-CM

## 2020-11-19 DIAGNOSIS — I10 Essential (primary) hypertension: Secondary | ICD-10-CM

## 2020-12-13 ENCOUNTER — Encounter: Payer: Self-pay | Admitting: Family Medicine

## 2020-12-13 DIAGNOSIS — I10 Essential (primary) hypertension: Secondary | ICD-10-CM

## 2020-12-14 MED ORDER — ELETRIPTAN HYDROBROMIDE 20 MG PO TABS: 20.0000 mg | ORAL_TABLET | ORAL | 2 refills | Status: DC | PRN

## 2020-12-14 MED ORDER — LABETALOL HCL 100 MG PO TABS: 100.0000 mg | ORAL_TABLET | Freq: Two times a day (BID) | ORAL | 2 refills | Status: DC

## 2021-03-30 ENCOUNTER — Telehealth (INDEPENDENT_AMBULATORY_CARE_PROVIDER_SITE_OTHER): Payer: BC Managed Care – PPO | Admitting: Family Medicine

## 2021-03-30 ENCOUNTER — Encounter: Payer: Self-pay | Admitting: Family Medicine

## 2021-03-30 VITALS — Ht 63.0 in

## 2021-03-30 DIAGNOSIS — Z3041 Encounter for surveillance of contraceptive pills: Secondary | ICD-10-CM | POA: Diagnosis not present

## 2021-03-30 DIAGNOSIS — U071 COVID-19: Secondary | ICD-10-CM

## 2021-03-30 MED ORDER — APRI 0.15-30 MG-MCG PO TABS: 1.0000 | ORAL_TABLET | Freq: Every day | ORAL | 3 refills | Status: DC

## 2021-03-30 MED ORDER — NIRMATRELVIR/RITONAVIR (PAXLOVID)TABLET: 3.0000 | ORAL_TABLET | Freq: Two times a day (BID) | ORAL | 0 refills | Status: AC

## 2021-03-30 NOTE — Progress Notes (Signed)
Virtual Visit via Video Note I connected with Veronica Fisher on 03/30/21 by a video enabled telemedicine application and verified that I am speaking with the correct person using two identifiers.  Location patient: home Location provider:work or home office Persons participating in the virtual visit: patient, provider  I discussed the limitations of evaluation and management by telemedicine and the availability of in person appointments. The patient expressed understanding and agreed to proceed.  Chief Complaint  Patient presents with   Covid Exposure   HPI: Veronica Fisher is a 45 yo female with history of hypertension, migraine headache, and anxiety complaining of a day of retrosternal burning sensation when breathing and fatigue. Negative for chills, fever, rhinorrhea, nasal congestion, sore throat, cough, wheezing, dyspnea, palpitations, abdominal pain, N/V, changes in bowel habits, urinary symptoms, or skin rash. She has not noted anosmia or ageusia. She is feeling better today.  She has not tried OTC medications.  Her husband recently diagnosed with COVID-19 infection. She had a home COVID-19 test positive today. COVID-19 vaccination completed +1 booster.  She is also requesting a refill on OCPs, currently she is not taking any in the past couple months. LMP 3 weeks ago. She is not actively trying to get pregnant. She has tolerated OCP well, no side effects. Negative for tobacco use.  ROS: See pertinent positives and negatives per HPI.  Past Medical History:  Diagnosis Date   Anxiety    Arthritis    Hypertension    Migraines    History reviewed. No pertinent surgical history.  Family History  Problem Relation Age of Onset   Arthritis Mother    Hypertension Mother    Social History   Socioeconomic History   Marital status: Married    Spouse name: Not on file   Number of children: Not on file   Years of education: Not on file   Highest education level: Not on file   Occupational History   Not on file  Tobacco Use   Smoking status: Never   Smokeless tobacco: Never  Vaping Use   Vaping Use: Never used  Substance and Sexual Activity   Alcohol use: Not Currently   Drug use: Never   Sexual activity: Yes    Partners: Male  Other Topics Concern   Not on file  Social History Narrative   Not on file   Social Determinants of Health   Financial Resource Strain: Not on file  Food Insecurity: Not on file  Transportation Needs: Not on file  Physical Activity: Not on file  Stress: Not on file  Social Connections: Not on file  Intimate Partner Violence: Not on file   Current Outpatient Medications:    Cholecalciferol (VITAMIN D3) 50 MCG (2000 UT) TABS, Take by mouth. 1 tablet twice a week, Disp: , Rfl:    eletriptan (RELPAX) 20 MG tablet, Take 1 tablet (20 mg total) by mouth as needed for migraine or headache. May repeat in 2 hours if headache persists or recurs., Disp: 10 tablet, Rfl: 2   hydroquinone 4 % cream, Apply topically 2 (two) times daily., Disp: 28.35 g, Rfl: 6   labetalol (NORMODYNE) 100 MG tablet, Take 1 tablet (100 mg total) by mouth 2 (two) times daily., Disp: 180 tablet, Rfl: 2   Multiple Vitamin (MULTIVITAMIN) tablet, Take 1 tablet by mouth. 1 tablet twice a week, Disp: , Rfl:    Omega-3 Fatty Acids (OMEGA 3 PO), Take 100 mg by mouth daily., Disp: , Rfl:    sertraline (ZOLOFT)  25 MG tablet, TAKE 1/2 TO 1 TABLET EVERY DAY, Disp: 90 tablet, Rfl: 2   APRI 0.15-30 MG-MCG tablet, Take 1 tablet by mouth daily., Disp: 84 tablet, Rfl: 3  EXAM:  VITALS per patient if applicable:Ht 5\' 3"  (1.6 m)   LMP 03/01/2021   BMI 23.43 kg/m   GENERAL: alert, oriented, appears well and in no acute distress  HEENT: atraumatic, conjunctiva clear, no obvious abnormalities on inspection of external nose and ears  NECK: normal movements of the head and neck  LUNGS: on inspection no signs of respiratory distress, breathing rate appears normal, no obvious  gross SOB, gasping or wheezing  CV: no obvious cyanosis  MS: moves all visible extremities without noticeable abnormality  PSYCH/NEURO: pleasant and cooperative, no obvious depression or anxiety, speech and thought processing grossly intact  ASSESSMENT AND PLAN:  Discussed the following assessment and plan:  COVID-19 virus infection - Plan: nirmatrelvir/ritonavir EUA (PAXLOVID) 20 x 150 MG & 10 x 100MG  TABS We discussed Dx,possible complications and treatment options. She has a mild case with low risk for complications,HTN. We discussed oral antiviral options and side effects. She would like to have antiviral medication called to her pharmacy and she will pick it up tomorrow if symptoms get worse. Paxlovid sent. Monitor for new symptoms. Symptomatic treatment with plenty of fluids,rest,tylenol 500 mg 3-4 times per day prn.  Explained that if cough and congestion developed ,these may last a few more days and even weeks after acute symptoms have resolved. Clearly instructed about warning signs.  Encounter for surveillance of contraceptive pills - Plan: APRI 0.15-30 MG-MCG tablet She is knowledgeable about possible side effects of OCP more so given her history of migraine headaches and her age, no history of tobacco use. She has tolerated OCPs well in the past, she would like to resume it, so prescription sent. Because risk of interaction with Paxlovid, if she decides to start medication for COVID 19 infection, she will hold on OCP until next menstrual cycle.  We discussed possible serious and likely etiologies, options for evaluation and workup, limitations of telemedicine visit vs in person visit, treatment, treatment risks and precautions.  I discussed the assessment and treatment plan with the patient. She was provided an opportunity to ask questions and all were answered. She agreed with the plan and demonstrated an understanding of the instructions.  Return if symptoms worsen or  fail to improve.  Veronica Fisher , MD

## 2021-04-02 ENCOUNTER — Encounter: Payer: Self-pay | Admitting: Family Medicine

## 2021-04-06 ENCOUNTER — Other Ambulatory Visit: Payer: Self-pay | Admitting: Family Medicine

## 2021-04-06 DIAGNOSIS — R059 Cough, unspecified: Secondary | ICD-10-CM

## 2021-04-06 MED ORDER — BENZONATATE 100 MG PO CAPS: 200.0000 mg | ORAL_CAPSULE | Freq: Two times a day (BID) | ORAL | 0 refills | Status: AC | PRN

## 2021-04-15 ENCOUNTER — Encounter: Payer: Self-pay | Admitting: Family Medicine

## 2021-04-21 ENCOUNTER — Other Ambulatory Visit: Payer: Self-pay | Admitting: Family Medicine

## 2021-04-21 DIAGNOSIS — R059 Cough, unspecified: Secondary | ICD-10-CM

## 2021-04-21 MED ORDER — ALBUTEROL SULFATE HFA 108 (90 BASE) MCG/ACT IN AERS: 2.0000 | INHALATION_SPRAY | Freq: Four times a day (QID) | RESPIRATORY_TRACT | 0 refills | Status: DC | PRN

## 2021-04-28 ENCOUNTER — Encounter: Payer: Self-pay | Admitting: Family Medicine

## 2021-05-05 ENCOUNTER — Encounter: Payer: Self-pay | Admitting: Family Medicine

## 2021-05-05 ENCOUNTER — Other Ambulatory Visit: Payer: Self-pay

## 2021-05-05 ENCOUNTER — Ambulatory Visit (INDEPENDENT_AMBULATORY_CARE_PROVIDER_SITE_OTHER)
Admission: RE | Admit: 2021-05-05 | Discharge: 2021-05-05 | Disposition: A | Discharge status: Home / Self Care | Payer: BC Managed Care – PPO | Source: Ambulatory Visit | Attending: Family Medicine | Admitting: Family Medicine

## 2021-05-05 ENCOUNTER — Ambulatory Visit (INDEPENDENT_AMBULATORY_CARE_PROVIDER_SITE_OTHER): Payer: BC Managed Care – PPO | Admitting: Family Medicine

## 2021-05-05 ENCOUNTER — Encounter: Payer: BC Managed Care – PPO | Admitting: Family Medicine

## 2021-05-05 VITALS — BP 110/70 | HR 93 | Resp 12 | Ht 63.0 in | Wt 125.2 lb

## 2021-05-05 DIAGNOSIS — Z13220 Encounter for screening for lipoid disorders: Secondary | ICD-10-CM

## 2021-05-05 DIAGNOSIS — Z23 Encounter for immunization: Secondary | ICD-10-CM

## 2021-05-05 DIAGNOSIS — R059 Cough, unspecified: Secondary | ICD-10-CM | POA: Diagnosis not present

## 2021-05-05 DIAGNOSIS — Z1329 Encounter for screening for other suspected endocrine disorder: Secondary | ICD-10-CM

## 2021-05-05 DIAGNOSIS — E049 Nontoxic goiter, unspecified: Secondary | ICD-10-CM

## 2021-05-05 DIAGNOSIS — Z13228 Encounter for screening for other metabolic disorders: Secondary | ICD-10-CM | POA: Diagnosis not present

## 2021-05-05 DIAGNOSIS — Z0000 Encounter for general adult medical examination without abnormal findings: Secondary | ICD-10-CM | POA: Diagnosis not present

## 2021-05-05 DIAGNOSIS — Z1231 Encounter for screening mammogram for malignant neoplasm of breast: Secondary | ICD-10-CM

## 2021-05-05 DIAGNOSIS — G43109 Migraine with aura, not intractable, without status migrainosus: Secondary | ICD-10-CM

## 2021-05-05 DIAGNOSIS — Z130 Encounter for screening for diseases of the blood and blood-forming organs and certain disorders involving the immune mechanism: Secondary | ICD-10-CM | POA: Diagnosis not present

## 2021-05-05 DIAGNOSIS — Z1211 Encounter for screening for malignant neoplasm of colon: Secondary | ICD-10-CM | POA: Diagnosis not present

## 2021-05-05 DIAGNOSIS — I10 Essential (primary) hypertension: Secondary | ICD-10-CM

## 2021-05-05 LAB — LIPID PANEL
Cholesterol: 164 mg/dL (ref 0–200)
HDL: 67.2 mg/dL (ref >39.00)
LDL Cholesterol: 83 mg/dL (ref 0–99)
NonHDL: 97.16
Total CHOL/HDL Ratio: 2
Triglycerides: 71 mg/dL (ref 0.0–149.0)
VLDL: 14.2 mg/dL (ref 0.0–40.0)

## 2021-05-05 LAB — BASIC METABOLIC PANEL
BUN: 12 mg/dL (ref 6–23)
CO2: 30 mEq/L (ref 19–32)
Calcium: 9.5 mg/dL (ref 8.4–10.5)
Chloride: 103 mEq/L (ref 96–112)
Creatinine, Ser: 0.7 mg/dL (ref 0.40–1.20)
GFR: 103.95 mL/min (ref >60.00)
Glucose, Bld: 95 mg/dL (ref 70–99)
Potassium: 4.5 mEq/L (ref 3.5–5.1)
Sodium: 139 mEq/L (ref 135–145)

## 2021-05-05 LAB — HEMOGLOBIN A1C: Hgb A1c MFr Bld: 5.3 % (ref 4.6–6.5)

## 2021-05-05 LAB — TSH: TSH: 1.68 u[IU]/mL (ref 0.35–5.50)

## 2021-05-05 NOTE — Assessment & Plan Note (Signed)
Problem is stable. She understands CV risk when taking OCP's. Continue Relpax 20 mg daily prn and Labetalol 100 mg bid.

## 2021-05-05 NOTE — Patient Instructions (Addendum)
A few things to remember from today's visit:   Routine general medical examination at a health care facility  Enlarged thyroid gland - Plan: US THYROID, TSH  Colon cancer screening - Plan: Ambulatory referral to Gastroenterology  Screening for lipoid disorders - Plan: Lipid panel  Screening for endocrine, metabolic and immunity disorder - Plan: Hemoglobin A1c, Basic metabolic panel  If you need refills please call your pharmacy. Do not use My Chart to request refills or for acute issues that need immediate attention.   No changes today. Continue monitor blood pressure.  Please be sure medication list is accurate. If a new problem present, please set up appointment sooner than planned today.  Health Maintenance, Female Adopting a healthy lifestyle and getting preventive care are important in promoting health and wellness. Ask your health care provider about: The right schedule for you to have regular tests and exams. Things you can do on your own to prevent diseases and keep yourself healthy. What should I know about diet, weight, and exercise? Eat a healthy diet  Eat a diet that includes plenty of vegetables, fruits, low-fat dairy products, and lean protein. Do not eat a lot of foods that are high in solid fats, added sugars, or sodium. Maintain a healthy weight Body mass index (BMI) is used to identify weight problems. It estimates body fat based on height and weight. Your health care provider can help determine your BMI and help you achieve or maintain a healthy weight. Get regular exercise Get regular exercise. This is one of the most important things you can do for your health. Most adults should: Exercise for at least 150 minutes each week. The exercise should increase your heart rate and make you sweat (moderate-intensity exercise). Do strengthening exercises at least twice a week. This is in addition to the moderate-intensity exercise. Spend less time sitting. Even light  physical activity can be beneficial. Watch cholesterol and blood lipids Have your blood tested for lipids and cholesterol at 46 years of age, then have this test every 5 years. Have your cholesterol levels checked more often if: Your lipid or cholesterol levels are high. You are older than 46 years of age. You are at high risk for heart disease. What should I know about cancer screening? Depending on your health history and family history, you may need to have cancer screening at various ages. This may include screening for: Breast cancer. Cervical cancer. Colorectal cancer. Skin cancer. Lung cancer. What should I know about heart disease, diabetes, and high blood pressure? Blood pressure and heart disease High blood pressure causes heart disease and increases the risk of stroke. This is more likely to develop in people who have high blood pressure readings, are of African descent, or are overweight. Have your blood pressure checked: Every 3-5 years if you are 16-69 years of age. Every year if you are 88 years old or older. Diabetes Have regular diabetes screenings. This checks your fasting blood sugar level. Have the screening done: Once every three years after age 66 if you are at a normal weight and have a low risk for diabetes. More often and at a younger age if you are overweight or have a high risk for diabetes. What should I know about preventing infection? Hepatitis B If you have a higher risk for hepatitis B, you should be screened for this virus. Talk with your health care provider to find out if you are at risk for hepatitis B infection. Hepatitis C Testing is recommended  for: Everyone born from [birth year] through 1966. Anyone with known risk factors for hepatitis C. Sexually transmitted infections (STIs) Get screened for STIs, including gonorrhea and chlamydia, if: You are sexually active and are younger than 46 years of age. You are older than 46 years of age and your health  care provider tells you that you are at risk for this type of infection. Your sexual activity has changed since you were last screened, and you are at increased risk for chlamydia or gonorrhea. Ask your health care provider if you are at risk. Ask your health care provider about whether you are at high risk for HIV. Your health care provider may recommend a prescription medicine to help prevent HIV infection. If you choose to take medicine to prevent HIV, you should first get tested for HIV. You should then be tested every 3 months for as long as you are taking the medicine. Pregnancy If you are about to stop having your period (premenopausal) and you may become pregnant, seek counseling before you get pregnant. Take 400 to 800 micrograms (mcg) of folic acid every day if you become pregnant. Ask for birth control (contraception) if you want to prevent pregnancy. Osteoporosis and menopause Osteoporosis is a disease in which the bones lose minerals and strength with aging. This can result in bone fractures. If you are 80 years old or older, or if you are at risk for osteoporosis and fractures, ask your health care provider if you should: Be screened for bone loss. Take a calcium or vitamin D supplement to lower your risk of fractures. Be given hormone replacement therapy (HRT) to treat symptoms of menopause. Follow these instructions at home: Lifestyle Do not use any products that contain nicotine or tobacco, such as cigarettes, e-cigarettes, and chewing tobacco. If you need help quitting, ask your health care provider. Do not use street drugs. Do not share needles. Ask your health care provider for help if you need support or information about quitting drugs. Alcohol use Do not drink alcohol if: Your health care provider tells you not to drink. You are pregnant, may be pregnant, or are planning to become pregnant. If you drink alcohol: Limit how much you use to 0-1 drink a day. Limit intake if  you are breastfeeding. Be aware of how much alcohol is in your drink. In the [place] one drink equals one 12 oz bottle of beer (355 mL), one 5 oz glass of wine (148 mL), or one 1 oz glass of hard liquor (44 mL). General instructions Schedule regular health, dental, and eye exams. Stay current with your vaccines. Tell your health care provider if: You often feel depressed. You have ever been abused or do not feel safe at home. Summary Adopting a healthy lifestyle and getting preventive care are important in promoting health and wellness. Follow your health care provider's instructions about healthy diet, exercising, and getting tested or screened for diseases. Follow your health care provider's instructions on monitoring your cholesterol and blood pressure. This information is not intended to replace advice given to you by your health care provider. Make sure you discuss any questions you have with your health care provider. Document Revised: 09/25/2020 Document Reviewed: 07/11/2018 Elsevier Patient Education  2022 [employer].

## 2021-05-05 NOTE — Progress Notes (Signed)
HPI: [name] is a 45 y.o. female, who is here today for her routine physical.  Last CPE: 02/07/20  Regular exercise 3 or more time per week: She is still swimming 2 times per week and walking a few times per week. Following a healthy diet: She does cook at home. She lives with her husband.  Chronic medical problems: Migraine headache,OA,HTN, and anxiety among some.  Pap smear:02/12/20. High risk HPV Negative   Adequacy Satisfactory for evaluation; transformation zone component PRESENT.   Diagnosis - Negative for intraepithelial lesion or malignancy (NILM)   Comment Normal Reference Range HPV - Negative    Hx of abnormal pap smears: Negative. G:1 A:1. Miscarriage recently, 10/2019.  L:0 LMP: 04/24/21.She is on OCP every other month.  Immunization History  Administered Date(s) Administered   Influenza,inj,Quad PF,6+ Mos 05/30/2018, 04/27/2020, 05/05/2021   Moderna Sars-Covid-2 Vaccination 10/24/2019, 11/26/2019, 06/24/2020   Tdap 02/07/2020, 06/24/2020   Mammogram: 02/10/20 Bi-Rads 1 Colonoscopy: She has colonoscopy and EGD in [place] 4-5 years ago. She is not sure about follow up. DEXA: N/A Hep C screening: 01/2020 NR.  Concerns and follow up today: She is concerned about possible left-sided thyroid nodule she noted when palpating area.  Hypertension:  Medications:Labetalol 100 mg bid. BP readings at home:120/80. Side effects:None  Negative for unusual or severe headache, visual changes, exertional chest pain, dyspnea,  focal weakness, or edema.  Lab Results  Component Value Date   CREATININE 0.63 02/07/2020   BUN 10 02/07/2020   NA 138 02/07/2020   K 4.0 02/07/2020   CL 101 02/07/2020   CO2 26 02/07/2020   Migraine headache: + Visual aura sometimes. Dx'ed around 2015. Right temporal throbbing headache with associated photophobia and nausea,no vomiting. Headaches are not frequent. No new associated symptoms. She takes Relpax 20 mg daily prn.  Cough,  residual after COVID 19 infection. It is slowly improving. Albuterol inh is not helping. No associated fever,chills,wheezing,or SOB. + Post nasal drainage. CXR order was placed a few days ago, she has not had it done.  Review of Systems  Constitutional:  Negative for appetite change, fatigue and fever.  HENT:  Positive for postnasal drip. Negative for hearing loss, mouth sores, sore throat and trouble swallowing.   Eyes:  Negative for photophobia and visual disturbance.  Respiratory:  Positive for cough. Negative for shortness of breath and wheezing.   Cardiovascular:  Negative for chest pain, palpitations and leg swelling.  Gastrointestinal:  Negative for abdominal pain, nausea and vomiting.       No changes in bowel habits.  Endocrine: Negative for cold intolerance, heat intolerance, polydipsia, polyphagia and polyuria.  Genitourinary:  Negative for decreased urine volume, dysuria, hematuria, vaginal bleeding and vaginal discharge.  Musculoskeletal:  Positive for arthralgias. Negative for gait problem and myalgias.  Skin:  Negative for color change and rash.  Allergic/Immunologic: Positive for environmental allergies.  Neurological:  Negative for syncope, facial asymmetry and weakness.  Hematological:  Negative for adenopathy. Does not bruise/bleed easily.  Psychiatric/Behavioral:  Negative for confusion.   All other systems reviewed and are negative.  Current Outpatient Medications on File Prior to Visit  Medication Sig Dispense Refill   albuterol (VENTOLIN HFA) 108 (90 Base) MCG/ACT inhaler Inhale 2 puffs into the lungs every 6 (six) hours as needed for wheezing or shortness of breath. 8 g 0   APRI 0.15-30 MG-MCG tablet Take 1 tablet by mouth daily. 84 tablet 3   Cholecalciferol (VITAMIN D3) 50 MCG (2000 UT) TABS Take  by mouth. 1 tablet twice a week     eletriptan (RELPAX) 20 MG tablet Take 1 tablet (20 mg total) by mouth as needed for migraine or headache. May repeat in 2 hours if  headache persists or recurs. 10 tablet 2   hydroquinone 4 % cream Apply topically 2 (two) times daily. 28.35 g 6   labetalol (NORMODYNE) 100 MG tablet Take 1 tablet (100 mg total) by mouth 2 (two) times daily. 180 tablet 2   Multiple Vitamin (MULTIVITAMIN) tablet Take 1 tablet by mouth. 1 tablet twice a week     Omega-3 Fatty Acids (OMEGA 3 PO) Take 100 mg by mouth daily.     No current facility-administered medications on file prior to visit.   Past Medical History:  Diagnosis Date   Anxiety    Arthritis    Hypertension    Migraines    History reviewed. No pertinent surgical history.  No Known Allergies  Family History  Problem Relation Age of Onset   Arthritis Mother    Hypertension Mother    Social History   Socioeconomic History   Marital status: Married    Spouse name: Not on file   Number of children: Not on file   Years of education: Not on file   Highest education level: Not on file  Occupational History   Not on file  Tobacco Use   Smoking status: Never   Smokeless tobacco: Never  Vaping Use   Vaping Use: Never used  Substance and Sexual Activity   Alcohol use: Not Currently   Drug use: Never   Sexual activity: Yes    Partners: Male  Other Topics Concern   Not on file  Social History Narrative   Not on file   Social Determinants of Health   Financial Resource Strain: Not on file  Food Insecurity: Not on file  Transportation Needs: Not on file  Physical Activity: Not on file  Stress: Not on file  Social Connections: Not on file   Vitals:   05/05/21 1059  BP: 110/70  Pulse: 93  Resp: 12  SpO2: 98%   Body mass index is 22.19 kg/m.  Wt Readings from Last 3 Encounters:  05/05/21 125 lb 4 oz (56.8 kg)  05/19/20 132 lb 4 oz (60 kg)  02/12/20 135 lb (61.2 kg)   Physical Exam Vitals and nursing note reviewed.  Constitutional:      General: She is not in acute distress.    Appearance: She is well-developed.  HENT:     Head: Normocephalic  and atraumatic.     Right Ear: Hearing, tympanic membrane, ear canal and external ear normal.     Left Ear: Hearing, tympanic membrane, ear canal and external ear normal.     Mouth/Throat:     Mouth: Mucous membranes are moist.     Pharynx: Oropharynx is clear. Uvula midline.  Eyes:     Extraocular Movements: Extraocular movements intact.     Conjunctiva/sclera: Conjunctivae normal.     Pupils: Pupils are equal, round, and reactive to light.  Neck:     Thyroid: Thyroid mass (? Small nodule left side.) present. No thyromegaly.     Trachea: No tracheal deviation.  Cardiovascular:     Rate and Rhythm: Normal rate and regular rhythm.     Pulses:          Dorsalis pedis pulses are 2+ on the right side and 2+ on the left side.       Posterior  tibial pulses are 2+ on the right side and 2+ on the left side.     Heart sounds: No murmur heard. Pulmonary:     Effort: Pulmonary effort is normal. No respiratory distress.     Breath sounds: Normal breath sounds.  Abdominal:     Palpations: Abdomen is soft. There is no hepatomegaly or mass.     Tenderness: There is no abdominal tenderness.  Genitourinary:    Comments: No concerns. Musculoskeletal:     Comments: No major deformity or signs of synovitis appreciated.  Lymphadenopathy:     Cervical: No cervical adenopathy.     Upper Body:     Right upper body: No supraclavicular adenopathy.     Left upper body: No supraclavicular adenopathy.  Skin:    General: Skin is warm.     Findings: No erythema or rash.  Neurological:     General: No focal deficit present.     Mental Status: She is alert and oriented to person, place, and time.     Cranial Nerves: No cranial nerve deficit.     Coordination: Coordination normal.     Gait: Gait normal.     Deep Tendon Reflexes:     Reflex Scores:      Bicep reflexes are 2+ on the right side and 2+ on the left side.      Patellar reflexes are 2+ on the right side and 2+ on the left side. Psychiatric:         Speech: Speech normal.     Comments: Well groomed, good eye contact.   ASSESSMENT AND PLAN:  [name] was here today annual physical examination.  Orders Placed This Encounter  Procedures   US THYROID   MM Digital Screening   Flu Vaccine QUAD 5mo+IM (Fluarix, Fluzone & Alfiuria Quad PF)   Lipid panel   Hemoglobin A1c   Basic metabolic panel   TSH   Ambulatory referral to Gastroenterology   Lab Results  Component Value Date   HGBA1C 5.3 05/05/2021   Lab Results  Component Value Date   CREATININE 0.70 05/05/2021   BUN 12 05/05/2021   NA 139 05/05/2021   K 4.5 05/05/2021   CL 103 05/05/2021   CO2 30 05/05/2021   Lab Results  Component Value Date   TSH 1.68 05/05/2021   Lab Results  Component Value Date   CHOL 164 05/05/2021   HDL 67.20 05/05/2021   LDLCALC 83 05/05/2021   TRIG 71.0 05/05/2021   CHOLHDL 2 05/05/2021   Routine general medical examination at a health care facility We discussed the importance of regular physical activity and healthy diet for prevention of chronic illness and/or complications. Preventive guidelines reviewed. Vaccination up to date.  Next CPE in a year. The 10-year ASCVD risk score (Arnett DK, et al., 2019) is: 0.5%   Values used to calculate the score:     Age: 37 years     Sex: Female     Is Non-Hispanic African American: No     Diabetic: No     Tobacco smoker: No     Systolic Blood Pressure: 110 mmHg     Is BP treated: Yes     HDL Cholesterol: 67.2 mg/dL     Total Cholesterol: 164 mg/dL  Enlarged thyroid gland ? Small thyroid nodule left side. Further recommendations according to thyroid US and TSH result.  Colon cancer screening -     Ambulatory referral to Gastroenterology  Screening for lipoid disorders -  Lipid panel  Screening for endocrine, metabolic and immunity disorder -     Hemoglobin A1c -     Basic metabolic panel  Need for influenza vaccination -     Flu Vaccine QUAD 32mo+IM (Fluarix,  Fluzone & Alfiuria Quad PF)  Encounter for screening mammogram for malignant neoplasm of breast -     MM Digital Screening; Future  Cough, unspecified type Explained that cough and congestion can last days and even weeks after acute symptoms have resolved. She is going to go today to have CXR done. Lung auscultation negative today.  Benign essential hypertension BP adequately controlled. Continue current management: Labetalol 100 mg twice daily. DASH/low salt diet to continue. Continue monitoring BP at home. Eye exam annually.  Migraine headache with aura Problem is stable. She understands CV risk when taking OCP's. Continue Relpax 20 mg daily prn and Labetalol 100 mg bid.  Return in 1 year (on 05/05/2022) for cpe and f/u.   Aftyn Nott G. [place], MD  [facility]. [place] office.

## 2021-05-05 NOTE — Assessment & Plan Note (Signed)
BP adequately controlled. Continue current management: Labetalol 100 mg twice daily. DASH/low salt diet to continue. Continue monitoring BP at home. Eye exam annually.

## 2021-05-13 ENCOUNTER — Telehealth: Payer: Self-pay | Admitting: Family Medicine

## 2021-05-13 DIAGNOSIS — Z111 Encounter for screening for respiratory tuberculosis: Secondary | ICD-10-CM

## 2021-05-13 NOTE — Telephone Encounter (Signed)
Patient is requesting a TB blood test.   Patient could be contacted at [phone].  Please advise.

## 2021-05-14 ENCOUNTER — Other Ambulatory Visit: Payer: Self-pay | Admitting: Family Medicine

## 2021-05-17 ENCOUNTER — Encounter: Payer: Self-pay | Admitting: Family Medicine

## 2021-05-18 NOTE — Telephone Encounter (Signed)
I spoke with pcp, okay to order blood test. Order placed & responded back to pt via mychart.

## 2021-05-19 ENCOUNTER — Other Ambulatory Visit: Payer: Self-pay

## 2021-05-19 ENCOUNTER — Other Ambulatory Visit: Payer: BC Managed Care – PPO

## 2021-05-19 ENCOUNTER — Telehealth: Payer: Self-pay | Admitting: Family Medicine

## 2021-05-19 DIAGNOSIS — Z111 Encounter for screening for respiratory tuberculosis: Secondary | ICD-10-CM

## 2021-05-19 NOTE — Telephone Encounter (Signed)
Patient brought in paperwork that she needs Dr.Jordan to complete. Paperwork would be placed in the folder.  Patient could be contacted at [phone].  Please advise.

## 2021-05-21 ENCOUNTER — Ambulatory Visit
Admission: RE | Admit: 2021-05-21 | Discharge: 2021-05-21 | Disposition: A | Discharge status: Home / Self Care | Payer: BC Managed Care – PPO | Source: Ambulatory Visit | Attending: Family Medicine | Admitting: Family Medicine

## 2021-05-21 DIAGNOSIS — E049 Nontoxic goiter, unspecified: Secondary | ICD-10-CM

## 2021-05-21 NOTE — Telephone Encounter (Signed)
Paperwork received, waiting on TB blood test results.

## 2021-05-23 LAB — QUANTIFERON-TB GOLD PLUS
Mitogen-NIL: >10 IU/mL
NIL: 0.04 IU/mL
QuantiFERON-TB Gold Plus: NEGATIVE
TB1-NIL: 0.03 IU/mL
TB2-NIL: 0.03 IU/mL

## 2021-05-24 ENCOUNTER — Encounter: Payer: Self-pay | Admitting: Family Medicine

## 2021-05-24 NOTE — Telephone Encounter (Signed)
Form filled out & in pcp's bin to be signed.

## 2021-05-24 NOTE — Telephone Encounter (Signed)
Patient is aware form is ready for pick up.

## 2021-06-08 ENCOUNTER — Other Ambulatory Visit (INDEPENDENT_AMBULATORY_CARE_PROVIDER_SITE_OTHER): Payer: BC Managed Care – PPO

## 2021-06-08 ENCOUNTER — Other Ambulatory Visit: Payer: Self-pay

## 2021-06-08 DIAGNOSIS — Z0184 Encounter for antibody response examination: Secondary | ICD-10-CM

## 2021-06-08 DIAGNOSIS — Z789 Other specified health status: Secondary | ICD-10-CM

## 2021-06-09 LAB — VARICELLA ZOSTER ANTIBODY, IGG: Varicella IgG: 2036 index

## 2021-06-11 ENCOUNTER — Ambulatory Visit
Admission: RE | Admit: 2021-06-11 | Discharge: 2021-06-11 | Disposition: A | Discharge status: Home / Self Care | Payer: BC Managed Care – PPO | Source: Ambulatory Visit | Attending: Family Medicine | Admitting: Family Medicine

## 2021-06-11 ENCOUNTER — Other Ambulatory Visit: Payer: Self-pay

## 2021-06-11 DIAGNOSIS — Z1231 Encounter for screening mammogram for malignant neoplasm of breast: Secondary | ICD-10-CM

## 2021-06-16 ENCOUNTER — Other Ambulatory Visit: Payer: Self-pay | Admitting: Family Medicine

## 2021-07-14 ENCOUNTER — Other Ambulatory Visit: Payer: Self-pay | Admitting: Family Medicine

## 2021-12-01 IMAGING — DX DG CHEST 2V
2 series · 2 of 2 positions shown · non-contrast
Comparison: None.

CLINICAL DATA: Severe cough for several weeks. Recent Dovid-1E
viral infection.

EXAM:
CHEST - 2 VIEW

[chest pa]
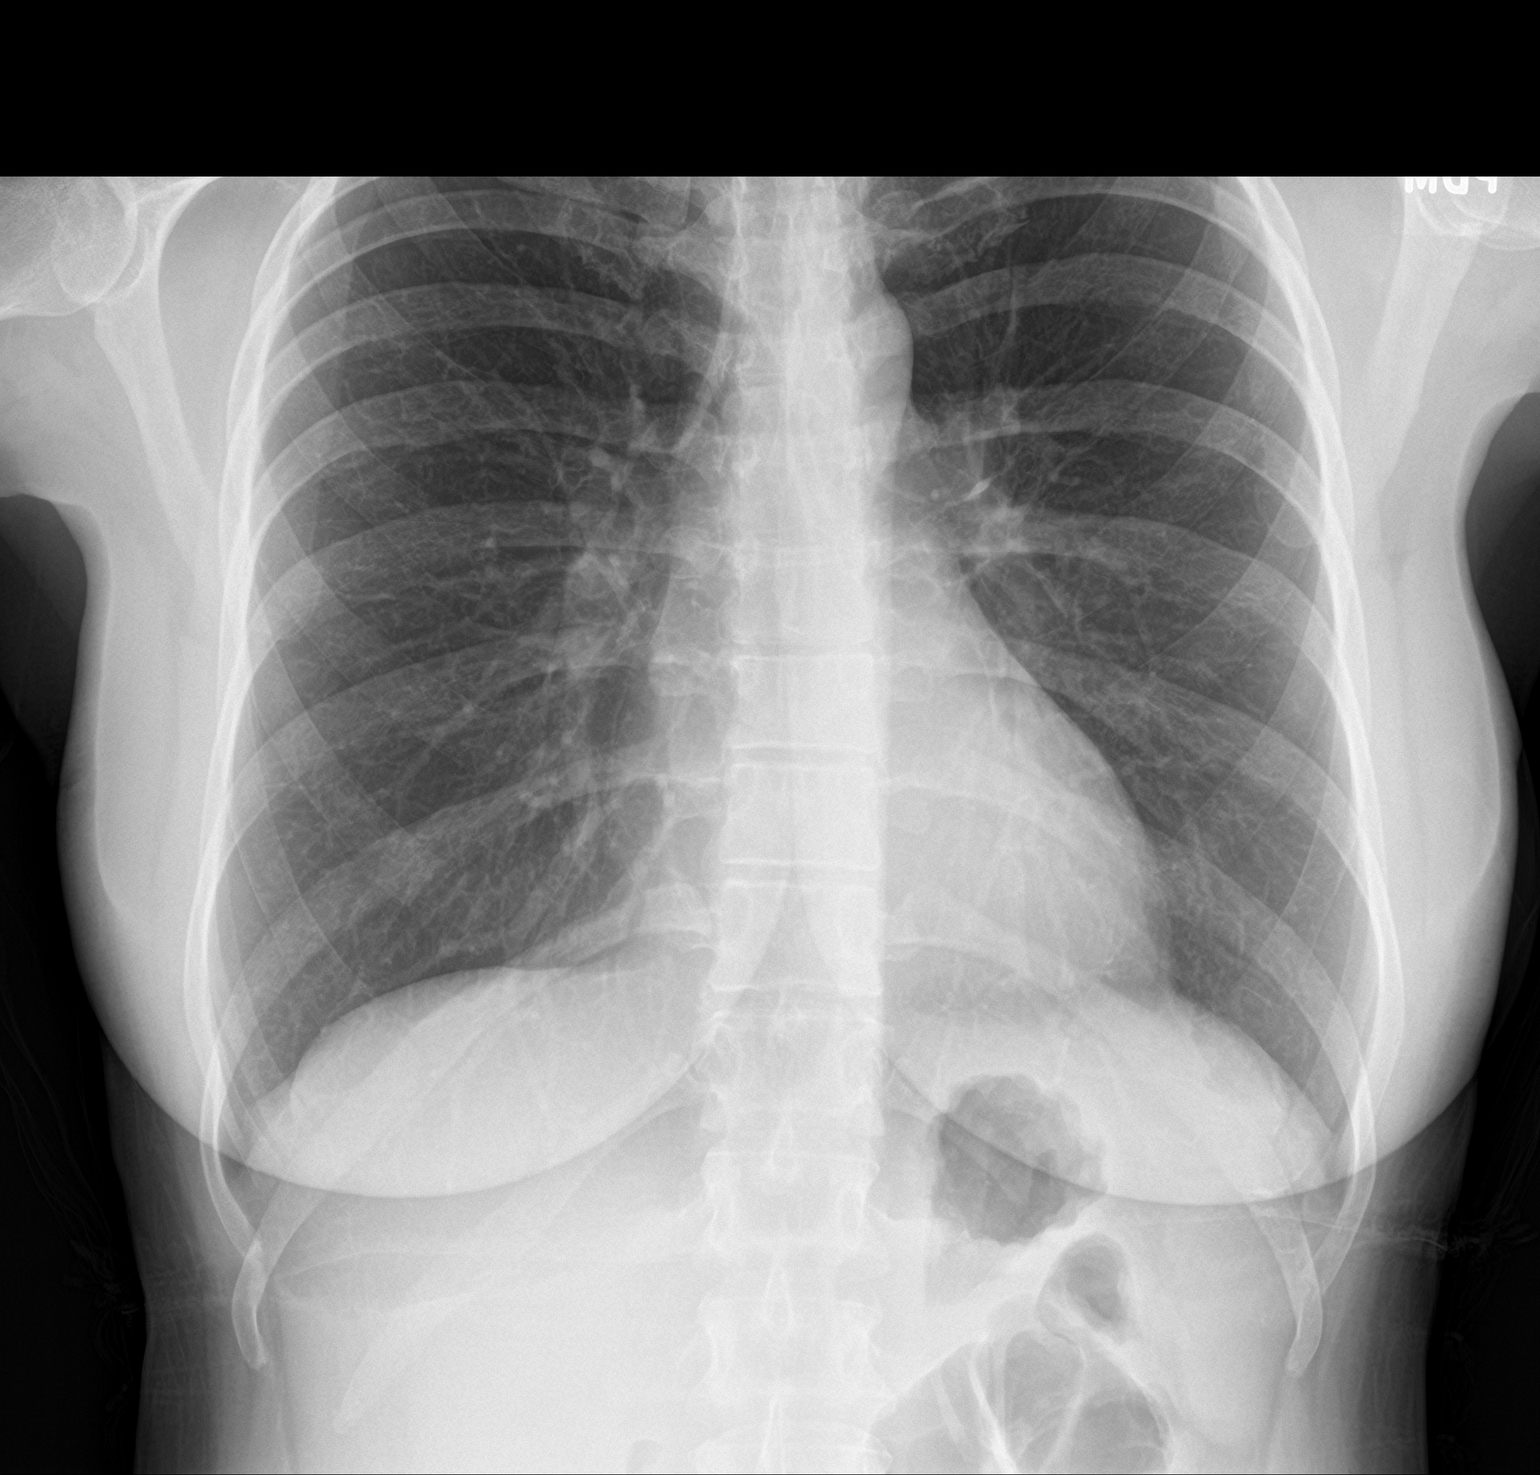

[chest lat]
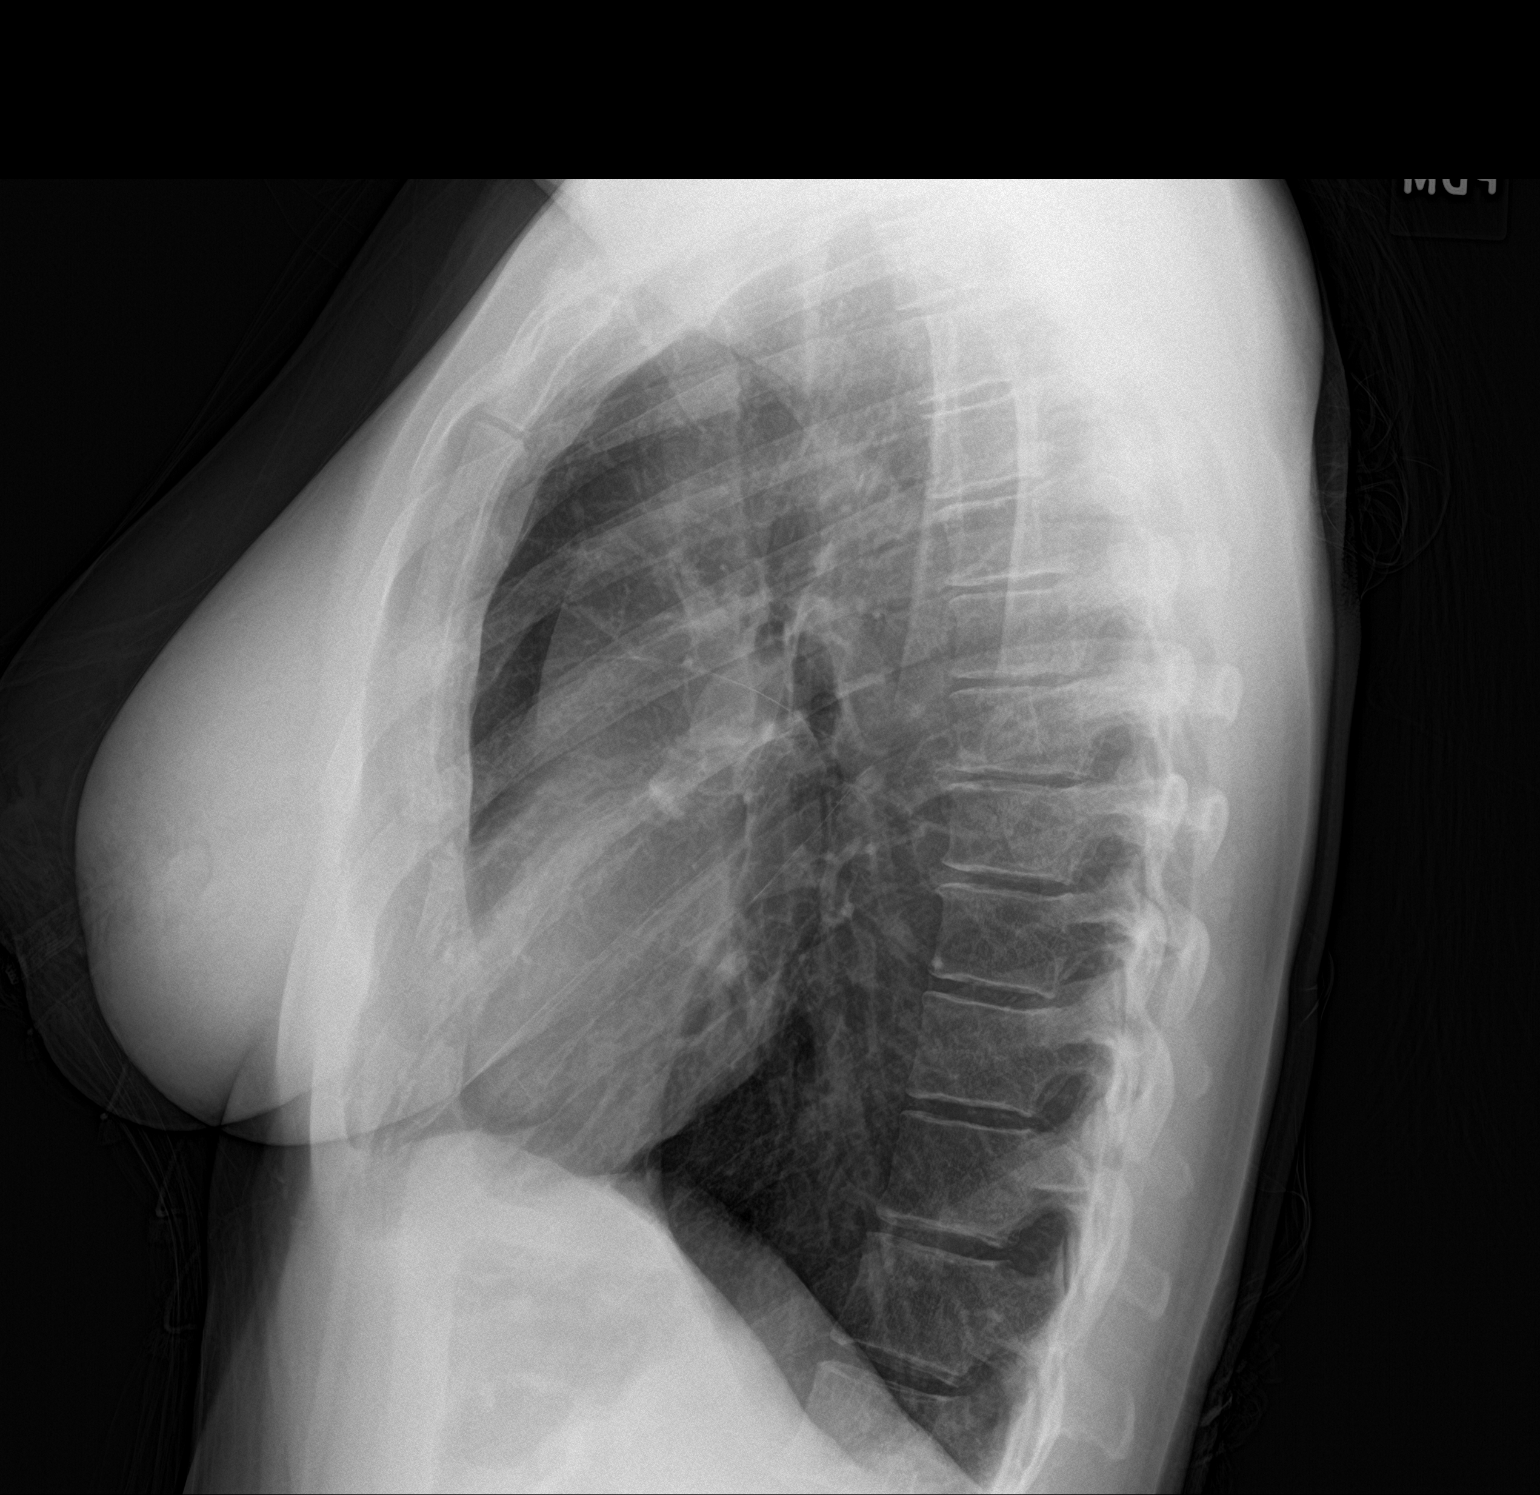

[2 of 2 positions shown; findings below may reference images not displayed]

FINDINGS: The heart size and mediastinal contours are within normal limits.
Both lungs are clear. The visualized skeletal structures are
unremarkable.
IMPRESSION: No active cardiopulmonary disease.

## 2021-12-08 ENCOUNTER — Other Ambulatory Visit: Payer: Self-pay | Admitting: Family Medicine

## 2021-12-08 DIAGNOSIS — I10 Essential (primary) hypertension: Secondary | ICD-10-CM

## 2021-12-29 ENCOUNTER — Other Ambulatory Visit: Payer: Self-pay | Admitting: Family Medicine

## 2022-01-07 IMAGING — MG MM DIGITAL SCREENING BILAT W/ TOMO AND CAD
8 series · 9 of 24 positions shown · non-contrast
Comparison: Previous exam(s).

CLINICAL DATA: Screening.

EXAM:
DIGITAL SCREENING BILATERAL MAMMOGRAM WITH TOMOSYNTHESIS AND CAD
TECHNIQUE: Bilateral screening digital craniocaudal and mediolateral oblique
mammograms were obtained. Bilateral screening digital breast
tomosynthesis was performed. The images were evaluated with
computer-aided detection.

[R CC synth-2D]
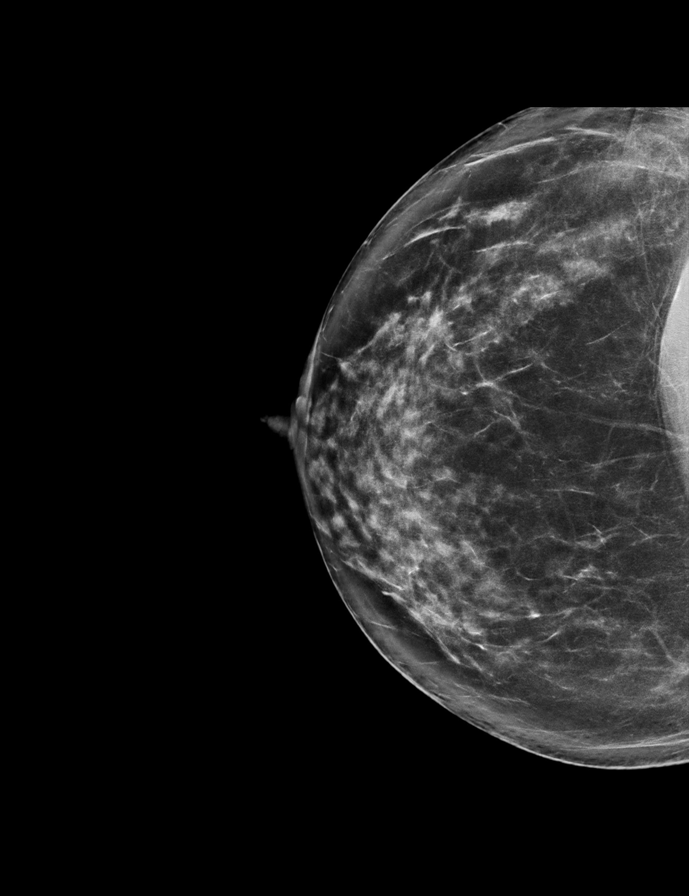

[L MLO synth-2D]
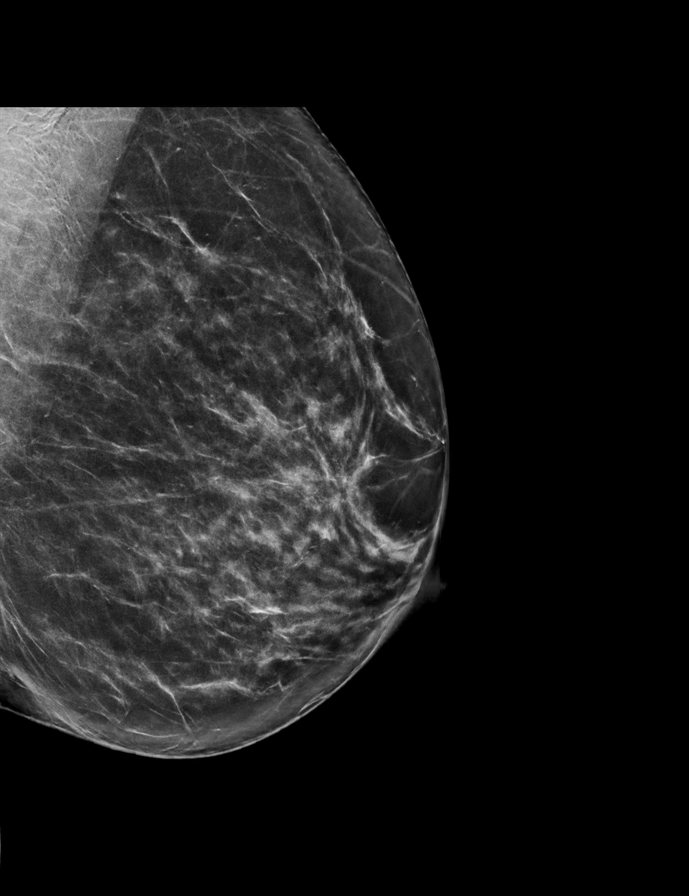

[L CC synth-2D]
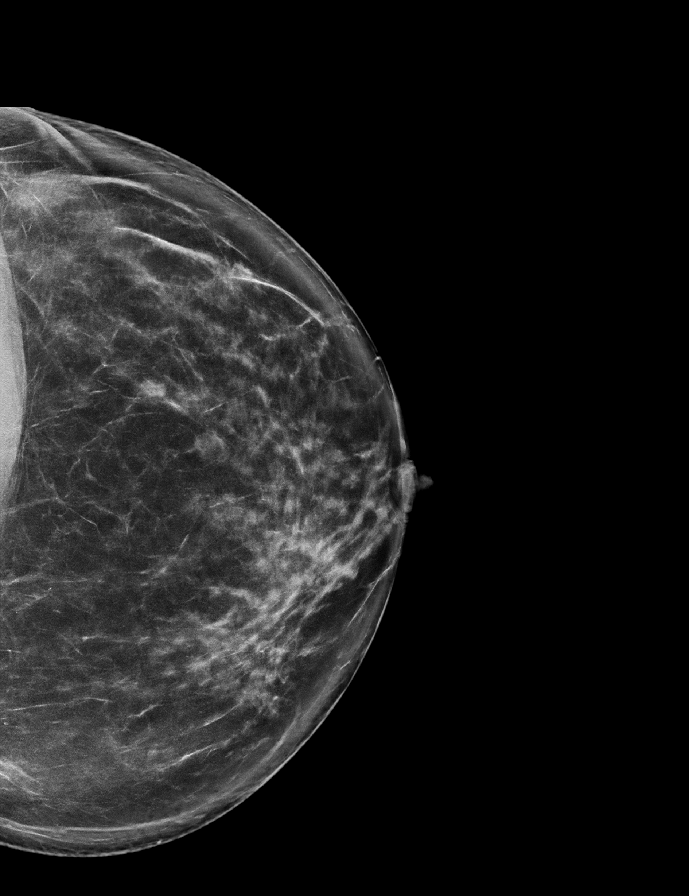

[R MLO synth-2D]
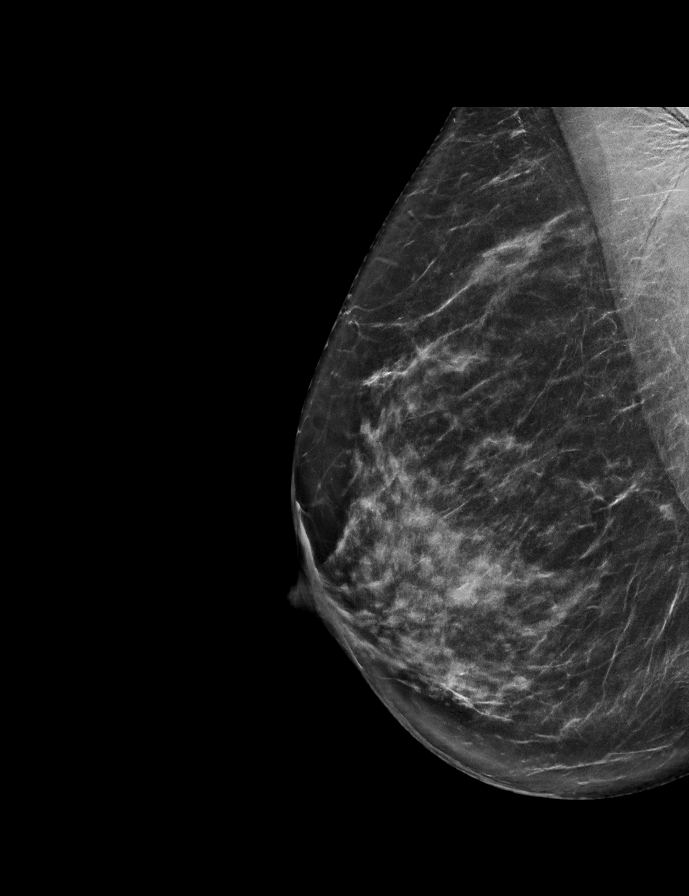

[R MLO tomo · 2 of 71 frames shown]
[frame 23/71]
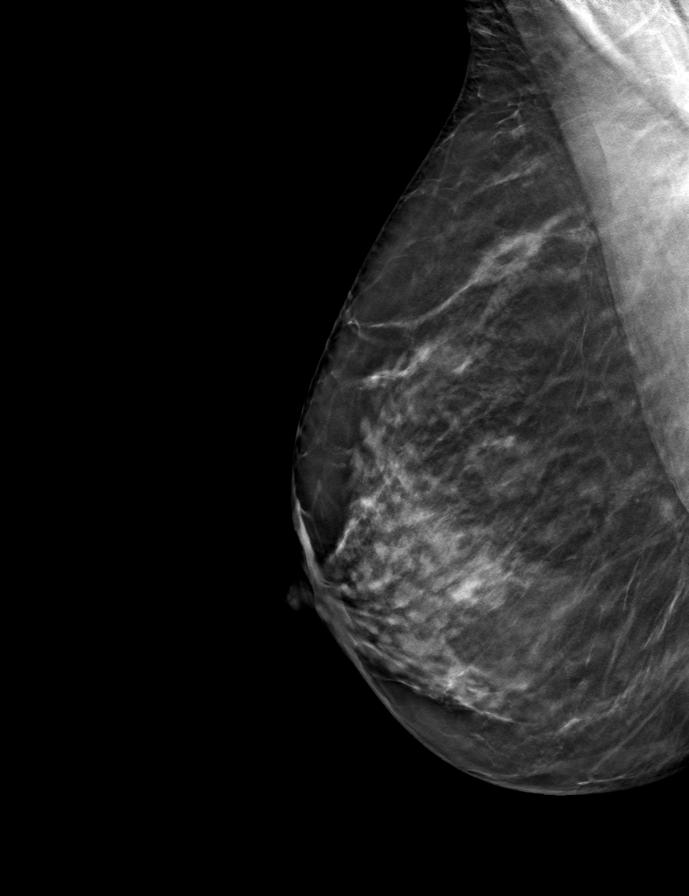
[frame 36/71]
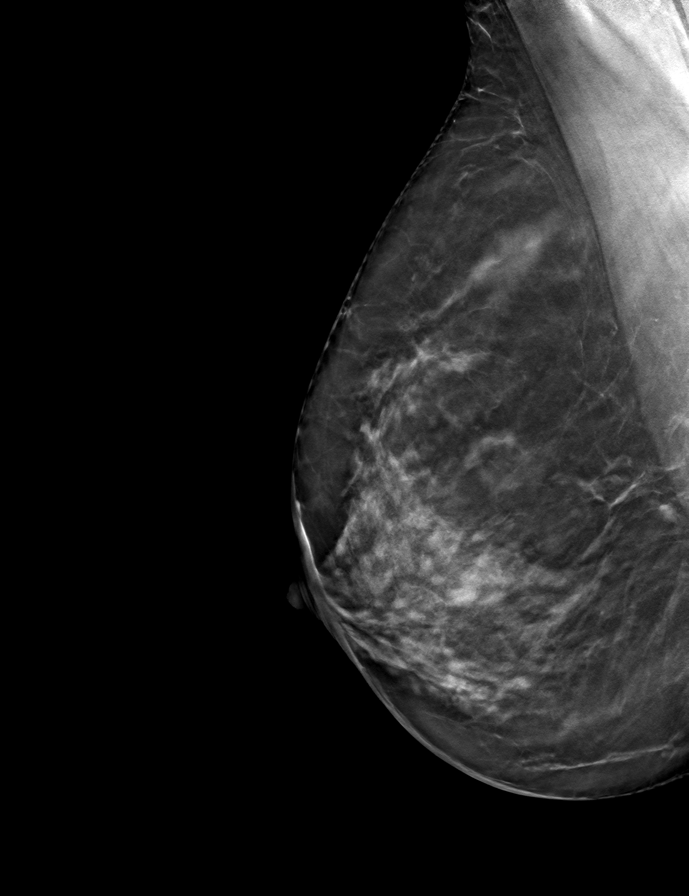

[R CC tomo · tomo slice 37/74.0]
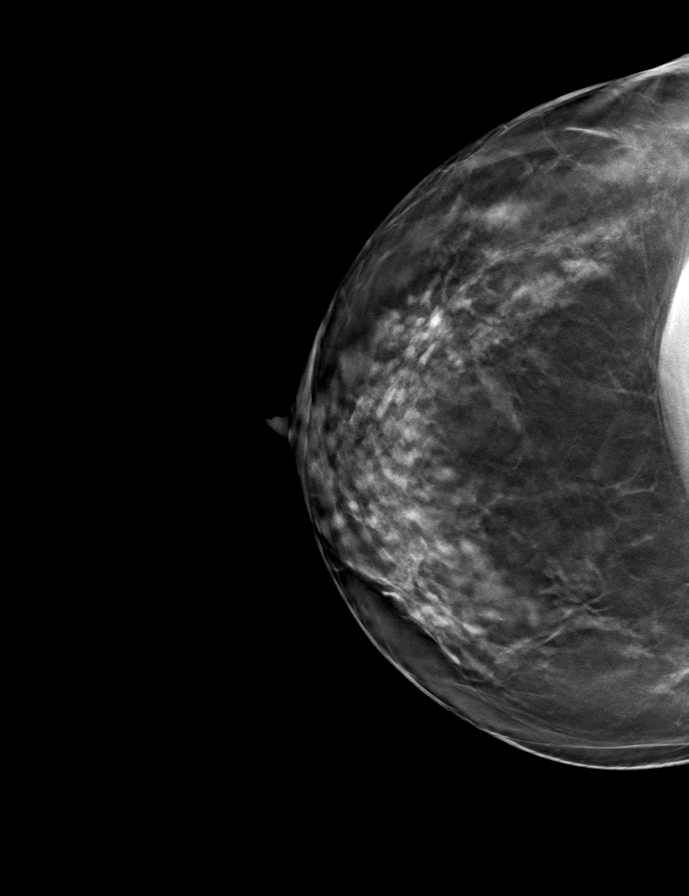

[L CC tomo · tomo slice 38/75.0]
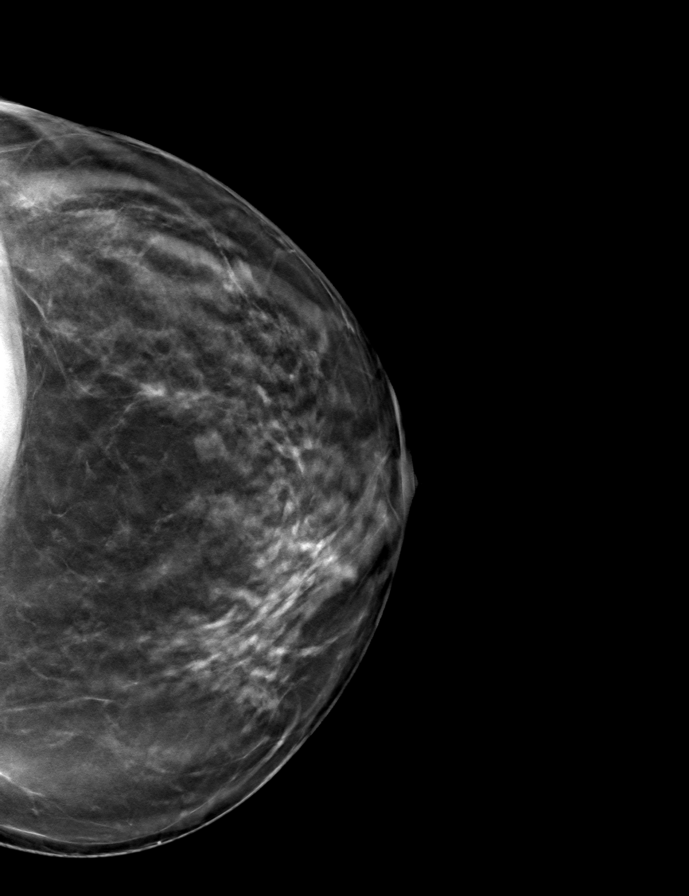

[L MLO tomo · tomo slice 35/70.0]
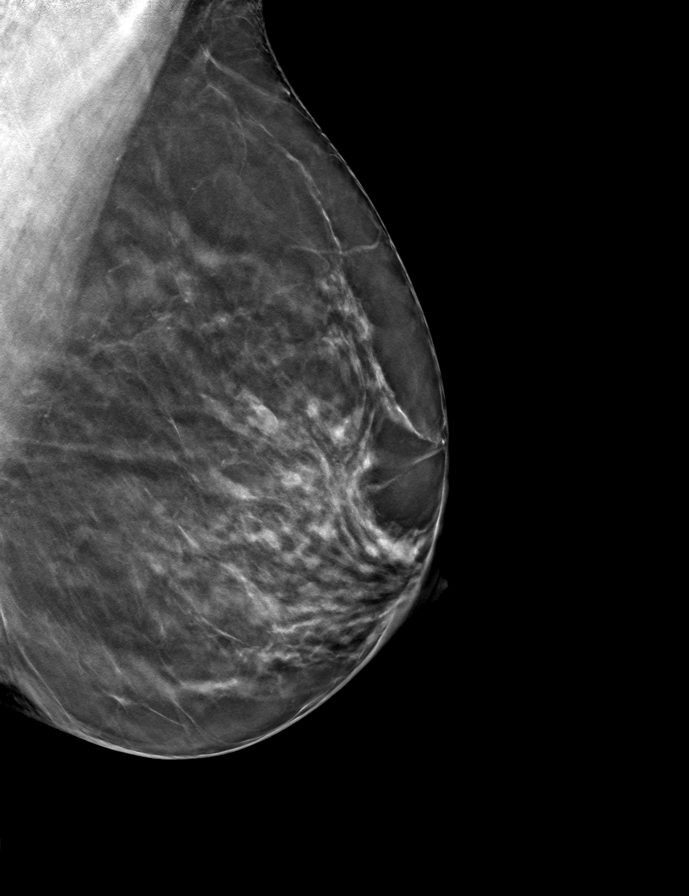

[9 of 24 positions shown; findings below may reference images not displayed]

ACR Breast Density Category c: The breast tissue is heterogeneously
dense, which may obscure small masses.
FINDINGS: There are no findings suspicious for malignancy.
IMPRESSION: No mammographic evidence of malignancy. A result letter of this
screening mammogram will be mailed directly to the patient.

RECOMMENDATION:
Screening mammogram in one year. (Code:Q3-W-BC3)

BI-RADS CATEGORY  1: Negative.

## 2022-02-28 ENCOUNTER — Other Ambulatory Visit: Payer: Self-pay | Admitting: Family Medicine

## 2022-02-28 DIAGNOSIS — Z3041 Encounter for surveillance of contraceptive pills: Secondary | ICD-10-CM

## 2022-04-28 ENCOUNTER — Encounter: Payer: Self-pay | Admitting: Family Medicine

## 2022-05-02 ENCOUNTER — Other Ambulatory Visit: Payer: Self-pay | Admitting: Family Medicine

## 2022-05-02 DIAGNOSIS — L811 Chloasma: Secondary | ICD-10-CM

## 2022-05-02 MED ORDER — TRETINOIN 0.1 % EX CREA: TOPICAL_CREAM | Freq: Every day | CUTANEOUS | 1 refills | Status: DC

## 2022-08-17 ENCOUNTER — Other Ambulatory Visit: Payer: Self-pay | Admitting: Family Medicine

## 2022-08-17 DIAGNOSIS — I10 Essential (primary) hypertension: Secondary | ICD-10-CM

## 2022-09-05 ENCOUNTER — Encounter: Payer: Self-pay | Admitting: Family Medicine

## 2022-09-05 ENCOUNTER — Telehealth (INDEPENDENT_AMBULATORY_CARE_PROVIDER_SITE_OTHER): Payer: BC Managed Care – PPO | Admitting: Family Medicine

## 2022-09-05 VITALS — Ht 63.0 in

## 2022-09-05 DIAGNOSIS — R051 Acute cough: Secondary | ICD-10-CM

## 2022-09-05 DIAGNOSIS — U071 COVID-19: Secondary | ICD-10-CM

## 2022-09-05 MED ORDER — BENZONATATE 100 MG PO CAPS: 100.0000 mg | ORAL_CAPSULE | Freq: Two times a day (BID) | ORAL | 0 refills | Status: AC | PRN

## 2022-09-05 MED ORDER — NIRMATRELVIR/RITONAVIR (PAXLOVID)TABLET: 3.0000 | ORAL_TABLET | Freq: Two times a day (BID) | ORAL | 0 refills | Status: AC

## 2022-09-05 MED ORDER — FLUTICASONE PROPIONATE 50 MCG/ACT NA SUSP: 1.0000 | Freq: Two times a day (BID) | NASAL | 0 refills | Status: AC

## 2022-09-05 NOTE — Progress Notes (Signed)
Virtual Visit via Video Note I connected with Lindora Sponsel on 09/05/22 by a video enabled telemedicine application and verified that I am speaking with the correct person using two identifiers. Location patient: home Location provider:work office Persons participating in the virtual visit: patient, provider  I discussed the limitations of evaluation and management by telemedicine and the availability of in person appointments. The patient expressed understanding and agreed to proceed.  Chief Complaint  Patient presents with   Covid Positive   HPI: [name] is a 47 yo female with history of anxiety, migraine headaches, and hypertension complaining of respiratory symptoms that started yesterday.  She had a positive COVID-19 test at home this morning. She reports experiencing burning sensation in the back of throat and upper chest, coughing, subjective fever, and  generalized myalgia.  Frontal headache, rhinorrhea, nasal congestion, and postnasal drainage. Cough is nonproductive. Negative for dyspnea, wheezing, CP, palpitation, abdominal pain, changes in bowel habits, nausea, vomiting, urinary symptoms, or skin rash.  She is currently taking Mucinex Cold, Flu, and Sore Throat and loratadine 10 mg daily.  She believes she contracted COVID-19 from a colleague at the hospital.  ROS: See pertinent positives and negatives per HPI.  Past Medical History:  Diagnosis Date   Anxiety    Arthritis    Hypertension    Migraines    History reviewed. No pertinent surgical history.  Family History  Problem Relation Age of Onset   Arthritis Mother    Hypertension Mother    Breast cancer Neg Hx    Social History   Socioeconomic History   Marital status: Married    Spouse name: Not on file   Number of children: Not on file   Years of education: Not on file   Highest education level: Not on file  Occupational History   Not on file  Tobacco Use   Smoking status: Never   Smokeless  tobacco: Never  Vaping Use   Vaping Use: Never used  Substance and Sexual Activity   Alcohol use: Not Currently   Drug use: Never   Sexual activity: Yes    Partners: Male  Other Topics Concern   Not on file  Social History Narrative   Not on file   Social Determinants of Health   Financial Resource Strain: Not on file  Food Insecurity: Not on file  Transportation Needs: Not on file  Physical Activity: Not on file  Stress: Not on file  Social Connections: Not on file  Intimate Partner Violence: Not on file   Current Outpatient Medications:    albuterol (VENTOLIN HFA) 108 (90 Base) MCG/ACT inhaler, TAKE 2 PUFFS BY MOUTH EVERY 6 HOURS AS NEEDED FOR WHEEZE OR SHORTNESS OF BREATH, Disp: 8.5 each, Rfl: 0   APRI 0.15-30 MG-MCG tablet, TAKE 1 TABLET BY MOUTH EVERY DAY, Disp: 84 tablet, Rfl: 3   Cholecalciferol (VITAMIN D3) 50 MCG (2000 UT) TABS, Take by mouth. 1 tablet twice a week, Disp: , Rfl:    eletriptan (RELPAX) 20 MG tablet, TAKE 1 TAB AS NEEDED FOR MIGRAINE OR HEADACHE. MAY REPEAT IN 2 HOURS IF HEADACHE PERSISTS OR RECURS, Disp: 10 tablet, Rfl: 2   hydroquinone 4 % cream, Apply topically 2 (two) times daily., Disp: 28.35 g, Rfl: 6   labetalol (NORMODYNE) 100 MG tablet, TAKE 1 TABLET BY MOUTH TWICE A DAY, Disp: 180 tablet, Rfl: 0   Multiple Vitamin (MULTIVITAMIN) tablet, Take 1 tablet by mouth. 1 tablet twice a week, Disp: , Rfl:  Omega-3 Fatty Acids (OMEGA 3 PO), Take 100 mg by mouth daily., Disp: , Rfl:    tretinoin (RETIN-A) 0.1 % cream, Apply topically at bedtime., Disp: 30 g, Rfl: 1  EXAM:  VITALS per patient if applicable:Ht 5\' 3"  (1.6 m)   BMI 22.19 kg/m   GENERAL: alert, oriented, appears well and in no acute distress  HEENT: atraumatic, conjunctiva clear, no obvious abnormalities on inspection of external nose and ears. Nasal congestion.  NECK: normal movements of the head and neck  LUNGS: on inspection no signs of respiratory distress, breathing rate appears  normal, no obvious gross SOB, gasping or wheezing  CV: no obvious cyanosis  MS: moves all visible extremities without noticeable abnormality  PSYCH/NEURO: pleasant and cooperative, no obvious depression or anxiety, speech and thought processing grossly intact  ASSESSMENT AND PLAN:  Discussed the following assessment and plan:  COVID-19 virus infection We discussed Dx,possible complications and treatment options. She has a mild to moderate case with some risk for complications given her hx of HTN. We discussed oral antiviral options and side effects. She agrees with trying Paxlovid.  She is not taking OCPs this month, she takes it intermittently due to side effects. Symptomatic treatment with plenty of fluids,rest,tylenol 500 mg 3-4 times per day prn.  5 to 7 days of quarantine. Clearly instructed about warning signs.  -     nirmatrelvir/ritonavir; Take 3 tablets by mouth 2 (two) times daily for 5 days. (Take nirmatrelvir 150 mg two tablets twice daily for 5 days and ritonavir 100 mg one tablet twice daily for 5 days) Patient GFR is 103  Dispense: 30 tablet; Refill: 0 -     Fluticasone Propionate; Place 1 spray into both nostrils 2 (two) times daily.  Dispense: 16 g; Refill: 0 -     Benzonatate; Take 1 capsule (100 mg total) by mouth 2 (two) times daily as needed for up to 10 days.  Dispense: 20 capsule; Refill: 0  Acute cough Recommend plenty of fluids and benzonatate 100 mg twice daily as needed. Explained that cough and congestion may last a few more days and even weeks after acute symptoms have resolved.  -     Benzonatate; Take 1 capsule (100 mg total) by mouth 2 (two) times daily as needed for up to 10 days.  Dispense: 20 capsule; Refill: 0   We discussed possible serious and likely etiologies, options for evaluation and workup, limitations of telemedicine visit vs in person visit, treatment, treatment risks and precautions. The patient was advised to call back or seek an  in-person evaluation if the symptoms worsen or if the condition fails to improve as anticipated. I discussed the assessment and treatment plan with the patient. The patient was provided an opportunity to ask questions and all were answered. The patient agreed with the plan and demonstrated an understanding of the instructions.  Return if symptoms worsen or fail to improve, for keep next appointment.  Yohann Curl G. [place], MD  [facility]. [place] office.

## 2022-10-17 ENCOUNTER — Other Ambulatory Visit: Payer: Self-pay | Admitting: Family Medicine

## 2022-10-17 DIAGNOSIS — L811 Chloasma: Secondary | ICD-10-CM

## 2022-10-25 NOTE — Progress Notes (Signed)
HPI: [name] is a 47 y.o. female, who is here today for chronic disease management.  Last seen on 05/05/21. No new problems since her last visit.  HTN: She is on Labetalol 100 mg bid. Negative for severe/frequent headache, visual changes, chest pain, dyspnea, palpitation, focal weakness, or edema. Swims 1-2 times per week.  Lab Results  Component Value Date   CREATININE 0.70 05/05/2021   BUN 12 05/05/2021   NA 139 05/05/2021   K 4.5 05/05/2021   CL 103 05/05/2021   CO2 30 05/05/2021   Vit D def: She is on Vit D 5000 U daily.  Migraine headaches: She has not had frequent episodes. She is on Relpax 20 mg daily as needed. No new associated symptoms.  She is still considering fertility treatment, planning on traveling to her home country, [place],for consultation. She has a list of labs she needs to have done.  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  Review of Systems  Constitutional:  Negative for activity change, appetite change and fever.  HENT:  Negative for mouth sores, nosebleeds and sore throat.   Respiratory:  Negative for cough and wheezing.   Gastrointestinal:  Negative for abdominal pain, nausea and vomiting.       Negative for changes in bowel habits.  Endocrine: Negative for cold intolerance and heat intolerance.  Genitourinary:  Negative for decreased urine volume, dysuria and hematuria.  Skin:  Negative for rash.  Neurological:  Negative for syncope, facial asymmetry and weakness.  See other pertinent positives and negatives in HPI.  Current Outpatient Medications on File Prior to Visit  Medication Sig Dispense Refill   APRI 0.15-30 MG-MCG tablet TAKE 1 TABLET BY MOUTH EVERY DAY 84 tablet 3   Cholecalciferol (VITAMIN D3) 50 MCG (2000 UT) TABS Take by mouth. 1 tablet twice a week     eletriptan (RELPAX) 20 MG tablet TAKE 1 TAB AS NEEDED FOR MIGRAINE OR HEADACHE. MAY REPEAT IN 2 HOURS IF HEADACHE PERSISTS OR RECURS  10 tablet 2   fluticasone (FLONASE) 50 MCG/ACT nasal spray Place 1 spray into both nostrils 2 (two) times daily. 16 g 0   hydroquinone 4 % cream Apply topically 2 (two) times daily. 28.35 g 6   labetalol (NORMODYNE) 100 MG tablet TAKE 1 TABLET BY MOUTH TWICE A DAY 180 tablet 0   Multiple Vitamin (MULTIVITAMIN) tablet Take 1 tablet by mouth. 1 tablet twice a week     Omega-3 Fatty Acids (OMEGA 3 PO) Take 100 mg by mouth daily.     tretinoin (RETIN-A) 0.1 % cream APPLY TO AFFECTED AREA EVERY DAY AT BEDTIME 20 g 1   No current facility-administered medications on file prior to visit.    Past Medical History:  Diagnosis Date   Anxiety    Arthritis    Hypertension    Migraines    No Known Allergies  Social History   Socioeconomic History   Marital status: Married    Spouse name: Not on file   Number of children: Not on file   Years of education: Not on file   Highest education level: Not on file  Occupational History   Not on file  Tobacco Use   Smoking status: Never   Smokeless tobacco: Never  Vaping Use   Vaping Use: Never used  Substance and Sexual Activity   Alcohol use: Not Currently   Drug use: Never   Sexual activity: Yes    Partners: Male  Other Topics Concern   Not on file  Social History Narrative   Not on file   Social Determinants of Health   Financial Resource Strain: Not on file  Food Insecurity: Not on file  Transportation Needs: Not on file  Physical Activity: Not on file  Stress: Not on file  Social Connections: Not on file   Today's Vitals   10/26/22 0710  BP: 110/70  Pulse: 67  Resp: 12  Temp: 98.7 F (37.1 C)  TempSrc: Oral  SpO2: 99%  Weight: 121 lb (54.9 kg)  Height: 5\' 3"  (1.6 m)   Body mass index is 21.43 kg/m.  Physical Exam Vitals and nursing note reviewed.  Constitutional:      General: She is not in acute distress.    Appearance: She is well-developed.  HENT:     Head: Normocephalic and atraumatic.  Eyes:      Conjunctiva/sclera: Conjunctivae normal.     Pupils: Pupils are equal, round, and reactive to light.  Cardiovascular:     Rate and Rhythm: Normal rate and regular rhythm.     Pulses:          Dorsalis pedis pulses are 2+ on the right side and 2+ on the left side.     Heart sounds: No murmur heard. Pulmonary:     Effort: Pulmonary effort is normal. No respiratory distress.     Breath sounds: Normal breath sounds.  Abdominal:     Palpations: Abdomen is soft. There is no hepatomegaly or mass.     Tenderness: There is no abdominal tenderness.  Lymphadenopathy:     Cervical: No cervical adenopathy.  Skin:    General: Skin is warm.     Findings: No erythema or rash.  Neurological:     Mental Status: She is alert and oriented to person, place, and time.     Cranial Nerves: No cranial nerve deficit.     Gait: Gait normal.  Psychiatric:        Mood and Affect: Mood and affect normal.   ASSESSMENT AND PLAN:  [name] was seen today for medical management of chronic issues.  Diagnoses and all orders for this visit: Lab Results  Component Value Date   TSH 2.45 10/26/2022   Lab Results  Component Value Date   WBC 7.3 10/26/2022   HGB 13.7 10/26/2022   HCT 40.2 10/26/2022   MCV 87.5 10/26/2022   PLT 241.0 10/26/2022   Lab Results  Component Value Date   CREATININE 0.74 10/26/2022   BUN 13 10/26/2022   NA 139 10/26/2022   K 3.9 10/26/2022   CL 103 10/26/2022   CO2 29 10/26/2022    Benign essential hypertension Assessment & Plan: BP adequately controlled. Continue Labetalol 100 mg twice daily and low salt diet. Continue monitoring BP at home.  Orders: -     Basic metabolic panel; Future -     TSH; Future -     CBC; Future  Migraine with aura and without status migrainosus, not intractable Assessment & Plan: Problem is stable. She is on Relpax 20 mg daily as needed, does not need it frequently.She can continue as needed as far as she is not pregnant.   Screening  for STD (sexually transmitted disease) -     RPR; Future -     Hepatitis C antibody; Future -     HIV Antibody (routine testing w rflx); Future -     Hepatitis B surface antigen; Future -  Hepatitis B surface antibody,qualitative; Future  Vitamin D deficiency, unspecified Assessment & Plan: Continue current dose of vit D supplementation. Further recommendations according to 25 OH vit D result.  Orders: -     VITAMIN D 25 Hydroxy (Vit-D Deficiency, Fractures); Future   Return in about 1 year (around 10/26/2023) for CPE.  Reta Norgren G. [place], MD  [facility]. [place] office.

## 2022-10-26 ENCOUNTER — Ambulatory Visit: Payer: BC Managed Care – PPO | Admitting: Family Medicine

## 2022-10-26 ENCOUNTER — Encounter: Payer: Self-pay | Admitting: Family Medicine

## 2022-10-26 VITALS — BP 110/70 | HR 67 | Temp 98.7°F | Resp 12 | Ht 63.0 in | Wt 121.0 lb

## 2022-10-26 DIAGNOSIS — I10 Essential (primary) hypertension: Secondary | ICD-10-CM | POA: Diagnosis not present

## 2022-10-26 DIAGNOSIS — G43109 Migraine with aura, not intractable, without status migrainosus: Secondary | ICD-10-CM | POA: Diagnosis not present

## 2022-10-26 DIAGNOSIS — Z113 Encounter for screening for infections with a predominantly sexual mode of transmission: Secondary | ICD-10-CM | POA: Diagnosis not present

## 2022-10-26 DIAGNOSIS — E559 Vitamin D deficiency, unspecified: Secondary | ICD-10-CM | POA: Insufficient documentation

## 2022-10-26 LAB — BASIC METABOLIC PANEL
BUN: 13 mg/dL (ref 6–23)
CO2: 29 mEq/L (ref 19–32)
Calcium: 9.4 mg/dL (ref 8.4–10.5)
Chloride: 103 mEq/L (ref 96–112)
Creatinine, Ser: 0.74 mg/dL (ref 0.40–1.20)
GFR: 96.25 mL/min (ref >60.00)
Glucose, Bld: 98 mg/dL (ref 70–99)
Potassium: 3.9 mEq/L (ref 3.5–5.1)
Sodium: 139 mEq/L (ref 135–145)

## 2022-10-26 LAB — CBC
HCT: 40.2 % (ref 36.0–46.0)
Hemoglobin: 13.7 g/dL (ref 12.0–15.0)
MCHC: 34.2 g/dL (ref 30.0–36.0)
MCV: 87.5 fl (ref 78.0–100.0)
Platelets: 241 10*3/uL (ref 150.0–400.0)
RBC: 4.59 Mil/uL (ref 3.87–5.11)
RDW: 13.2 % (ref 11.5–15.5)
WBC: 7.3 10*3/uL (ref 4.0–10.5)

## 2022-10-26 LAB — TSH: TSH: 2.45 u[IU]/mL (ref 0.35–5.50)

## 2022-10-26 LAB — VITAMIN D 25 HYDROXY (VIT D DEFICIENCY, FRACTURES): VITD: 68.36 ng/mL (ref 30.00–100.00)

## 2022-10-26 NOTE — Assessment & Plan Note (Signed)
Continue current dose of vit D supplementation. Further recommendations according to 25 OH vit D result. 

## 2022-10-26 NOTE — Assessment & Plan Note (Addendum)
Problem is stable. She is on Relpax 20 mg daily as needed, does not need it frequently.She can continue as needed as far as she is not pregnant.

## 2022-10-26 NOTE — Assessment & Plan Note (Signed)
BP adequately controlled. Continue Labetalol 100 mg twice daily and low salt diet. Continue monitoring BP at home.

## 2022-10-26 NOTE — Patient Instructions (Addendum)
A few things to remember from today's visit:  Benign essential hypertension - Plan: Basic metabolic panel, TSH, CBC  Migraine with aura and without status migrainosus, not intractable  Screening for STD (sexually transmitted disease) - Plan: RPR, Hepatitis C antibody, HIV Antibody (routine testing w rflx), Hepatitis B surface antigen, Hepatitis B surface antibody,qualitative  Vitamin D deficiency, unspecified - Plan: VITAMIN D 25 Hydroxy (Vit-D Deficiency, Fractures)  No changes today. Good luck with school!!!  If you need refills for medications you take chronically, please call your pharmacy. Do not use My Chart to request refills or for acute issues that need immediate attention. If you send a my chart message, it may take a few days to be addressed, specially if I am not in the office.  Please be sure medication list is accurate. If a new problem present, please set up appointment sooner than planned today.

## 2022-10-27 LAB — HEPATITIS B SURFACE ANTIGEN: Hepatitis B Surface Ag: NONREACTIVE

## 2022-10-27 LAB — HEPATITIS C ANTIBODY: Hepatitis C Ab: NONREACTIVE

## 2022-10-27 LAB — HIV ANTIBODY (ROUTINE TESTING W REFLEX): HIV 1&2 Ab, 4th Generation: NONREACTIVE

## 2022-10-27 LAB — RPR: RPR Ser Ql: NONREACTIVE

## 2022-10-27 LAB — HEPATITIS B SURFACE ANTIBODY,QUALITATIVE: Hep B S Ab: REACTIVE — AB

## 2022-11-08 NOTE — Progress Notes (Unsigned)
   ACUTE VISIT No chief complaint on file.  HPI: [name] is a 47 y.o. female, who is here today complaining of *** HPI  Review of Systems See other pertinent positives and negatives in HPI.  Current Outpatient Medications on File Prior to Visit  Medication Sig Dispense Refill   APRI 0.15-30 MG-MCG tablet TAKE 1 TABLET BY MOUTH EVERY DAY 84 tablet 3   Cholecalciferol (VITAMIN D3) 50 MCG (2000 UT) TABS Take by mouth. 1 tablet twice a week     eletriptan (RELPAX) 20 MG tablet TAKE 1 TAB AS NEEDED FOR MIGRAINE OR HEADACHE. MAY REPEAT IN 2 HOURS IF HEADACHE PERSISTS OR RECURS 10 tablet 2   fluticasone (FLONASE) 50 MCG/ACT nasal spray Place 1 spray into both nostrils 2 (two) times daily. 16 g 0   hydroquinone 4 % cream Apply topically 2 (two) times daily. 28.35 g 6   labetalol (NORMODYNE) 100 MG tablet TAKE 1 TABLET BY MOUTH TWICE A DAY 180 tablet 0   Multiple Vitamin (MULTIVITAMIN) tablet Take 1 tablet by mouth. 1 tablet twice a week     Omega-3 Fatty Acids (OMEGA 3 PO) Take 100 mg by mouth daily.     tretinoin (RETIN-A) 0.1 % cream APPLY TO AFFECTED AREA EVERY DAY AT BEDTIME 20 g 1   No current facility-administered medications on file prior to visit.    Past Medical History:  Diagnosis Date   Anxiety    Arthritis    Hypertension    Migraines    No Known Allergies  Social History   Socioeconomic History   Marital status: Married    Spouse name: Not on file   Number of children: Not on file   Years of education: Not on file   Highest education level: Not on file  Occupational History   Not on file  Tobacco Use   Smoking status: Never   Smokeless tobacco: Never  Vaping Use   Vaping Use: Never used  Substance and Sexual Activity   Alcohol use: Not Currently   Drug use: Never   Sexual activity: Yes    Partners: Male  Other Topics Concern   Not on file  Social History Narrative   Not on file   Social Determinants of Health   Financial Resource Strain: Not  on file  Food Insecurity: Not on file  Transportation Needs: Not on file  Physical Activity: Not on file  Stress: Not on file  Social Connections: Not on file    There were no vitals filed for this visit. There is no height or weight on file to calculate BMI.  Physical Exam  ASSESSMENT AND PLAN: There are no diagnoses linked to this encounter.  No follow-ups on file.  Shamarr Faucett G. [place], MD  [facility]. [place] office.  Discharge Instructions   None

## 2022-11-09 ENCOUNTER — Encounter: Payer: Self-pay | Admitting: Family Medicine

## 2022-11-09 ENCOUNTER — Ambulatory Visit (INDEPENDENT_AMBULATORY_CARE_PROVIDER_SITE_OTHER): Payer: BC Managed Care – PPO | Admitting: Family Medicine

## 2022-11-09 ENCOUNTER — Other Ambulatory Visit (HOSPITAL_COMMUNITY)
Admission: RE | Admit: 2022-11-09 | Discharge: 2022-11-09 | Disposition: A | Discharge status: Home / Self Care | Payer: BC Managed Care – PPO | Source: Ambulatory Visit | Attending: Family Medicine | Admitting: Family Medicine

## 2022-11-09 VITALS — BP 118/70 | HR 76 | Temp 97.9°F | Resp 12 | Ht 63.0 in | Wt 121.2 lb

## 2022-11-09 DIAGNOSIS — Z1231 Encounter for screening mammogram for malignant neoplasm of breast: Secondary | ICD-10-CM

## 2022-11-09 DIAGNOSIS — Z124 Encounter for screening for malignant neoplasm of cervix: Secondary | ICD-10-CM

## 2022-11-09 DIAGNOSIS — N926 Irregular menstruation, unspecified: Secondary | ICD-10-CM | POA: Diagnosis not present

## 2022-11-09 DIAGNOSIS — I10 Essential (primary) hypertension: Secondary | ICD-10-CM | POA: Diagnosis not present

## 2022-11-09 MED ORDER — LABETALOL HCL 100 MG PO TABS: 100.0000 mg | ORAL_TABLET | Freq: Two times a day (BID) | ORAL | 3 refills | Status: DC

## 2022-11-09 NOTE — Patient Instructions (Addendum)
A few things to remember from today's visit:  Irregular menses - Plan: PAP [[place]], Follicle stimulating hormone, Luteinizing hormone  Screening for cervical cancer - Plan: PAP [[place]]  Benign essential hypertension - Plan: labetalol (NORMODYNE) 100 MG tablet  No changes today. Prescription for labetalol sent. Prenatal vitamins.  If you need refills for medications you take chronically, please call your pharmacy. Do not use My Chart to request refills or for acute issues that need immediate attention. If you send a my chart message, it may take a few days to be addressed, specially if I am not in the office.  Please be sure medication list is accurate. If a new problem present, please set up appointment sooner than planned today.

## 2022-11-10 ENCOUNTER — Encounter: Payer: Self-pay | Admitting: Family Medicine

## 2022-11-11 ENCOUNTER — Ambulatory Visit
Admission: RE | Admit: 2022-11-11 | Discharge: 2022-11-11 | Disposition: A | Discharge status: Home / Self Care | Payer: BC Managed Care – PPO | Source: Ambulatory Visit | Attending: Family Medicine | Admitting: Family Medicine

## 2022-11-11 DIAGNOSIS — Z1231 Encounter for screening mammogram for malignant neoplasm of breast: Secondary | ICD-10-CM

## 2022-11-11 LAB — CYTOLOGY - PAP
Comment: NEGATIVE
Diagnosis: NEGATIVE
High risk HPV: NEGATIVE

## 2022-11-12 ENCOUNTER — Other Ambulatory Visit: Payer: Self-pay | Admitting: Family Medicine

## 2022-11-12 DIAGNOSIS — I10 Essential (primary) hypertension: Secondary | ICD-10-CM

## 2022-11-14 ENCOUNTER — Ambulatory Visit: Payer: BC Managed Care – PPO

## 2022-12-20 ENCOUNTER — Other Ambulatory Visit: Payer: Self-pay | Admitting: Family Medicine

## 2022-12-20 DIAGNOSIS — L811 Chloasma: Secondary | ICD-10-CM

## 2023-02-12 ENCOUNTER — Other Ambulatory Visit: Payer: Self-pay | Admitting: Family Medicine

## 2023-02-12 DIAGNOSIS — Z3041 Encounter for surveillance of contraceptive pills: Secondary | ICD-10-CM

## 2023-04-15 ENCOUNTER — Other Ambulatory Visit: Payer: Self-pay | Admitting: Family Medicine

## 2023-04-15 DIAGNOSIS — L811 Chloasma: Secondary | ICD-10-CM

## 2024-02-18 ENCOUNTER — Other Ambulatory Visit: Payer: Self-pay | Admitting: Family Medicine

## 2024-02-18 DIAGNOSIS — Z3041 Encounter for surveillance of contraceptive pills: Secondary | ICD-10-CM

## 2024-05-12 ENCOUNTER — Other Ambulatory Visit: Payer: Self-pay | Admitting: Family Medicine

## 2024-05-12 DIAGNOSIS — Z3041 Encounter for surveillance of contraceptive pills: Secondary | ICD-10-CM
